# Patient Record
Sex: Female | Born: 1960
Health system: Southern US, Community
[De-identification: ages and names within clinical notes are randomized; demographics above are authoritative.]

## PROBLEM LIST (undated history)

## (undated) DIAGNOSIS — K529 Noninfective gastroenteritis and colitis, unspecified: Secondary | ICD-10-CM

## (undated) DIAGNOSIS — J449 Chronic obstructive pulmonary disease, unspecified: Secondary | ICD-10-CM

## (undated) HISTORY — PX: COLON SURGERY: SHX602

## (undated) HISTORY — PX: CHOLECYSTECTOMY: SHX55

---

## 2017-03-19 DIAGNOSIS — R4182 Altered mental status, unspecified: Secondary | ICD-10-CM | POA: Diagnosis not present

## 2017-03-19 DIAGNOSIS — M7989 Other specified soft tissue disorders: Secondary | ICD-10-CM | POA: Diagnosis not present

## 2017-03-19 DIAGNOSIS — G894 Chronic pain syndrome: Secondary | ICD-10-CM | POA: Diagnosis not present

## 2017-03-19 DIAGNOSIS — G92 Toxic encephalopathy: Secondary | ICD-10-CM | POA: Diagnosis not present

## 2017-03-19 DIAGNOSIS — J9601 Acute respiratory failure with hypoxia: Secondary | ICD-10-CM

## 2017-03-19 DIAGNOSIS — J441 Chronic obstructive pulmonary disease with (acute) exacerbation: Secondary | ICD-10-CM | POA: Diagnosis not present

## 2017-03-20 DIAGNOSIS — R4182 Altered mental status, unspecified: Secondary | ICD-10-CM | POA: Diagnosis not present

## 2017-03-20 DIAGNOSIS — M7989 Other specified soft tissue disorders: Secondary | ICD-10-CM | POA: Diagnosis not present

## 2017-03-20 DIAGNOSIS — J9601 Acute respiratory failure with hypoxia: Secondary | ICD-10-CM | POA: Diagnosis not present

## 2017-03-20 DIAGNOSIS — G894 Chronic pain syndrome: Secondary | ICD-10-CM | POA: Diagnosis not present

## 2017-03-21 DIAGNOSIS — R4182 Altered mental status, unspecified: Secondary | ICD-10-CM | POA: Diagnosis not present

## 2017-03-21 DIAGNOSIS — M7989 Other specified soft tissue disorders: Secondary | ICD-10-CM | POA: Diagnosis not present

## 2017-03-21 DIAGNOSIS — J9601 Acute respiratory failure with hypoxia: Secondary | ICD-10-CM | POA: Diagnosis not present

## 2017-03-21 DIAGNOSIS — G894 Chronic pain syndrome: Secondary | ICD-10-CM | POA: Diagnosis not present

## 2017-05-02 DIAGNOSIS — K219 Gastro-esophageal reflux disease without esophagitis: Secondary | ICD-10-CM | POA: Diagnosis not present

## 2017-05-02 DIAGNOSIS — J9601 Acute respiratory failure with hypoxia: Secondary | ICD-10-CM

## 2017-05-02 DIAGNOSIS — J441 Chronic obstructive pulmonary disease with (acute) exacerbation: Secondary | ICD-10-CM | POA: Diagnosis not present

## 2017-05-02 DIAGNOSIS — Z933 Colostomy status: Secondary | ICD-10-CM

## 2017-05-02 DIAGNOSIS — R0902 Hypoxemia: Secondary | ICD-10-CM

## 2017-05-03 DIAGNOSIS — J441 Chronic obstructive pulmonary disease with (acute) exacerbation: Secondary | ICD-10-CM | POA: Diagnosis not present

## 2017-05-03 DIAGNOSIS — Z933 Colostomy status: Secondary | ICD-10-CM | POA: Diagnosis not present

## 2017-05-03 DIAGNOSIS — R0902 Hypoxemia: Secondary | ICD-10-CM | POA: Diagnosis not present

## 2017-05-03 DIAGNOSIS — J9601 Acute respiratory failure with hypoxia: Secondary | ICD-10-CM | POA: Diagnosis not present

## 2017-11-06 DIAGNOSIS — Z933 Colostomy status: Secondary | ICD-10-CM

## 2017-11-06 DIAGNOSIS — K219 Gastro-esophageal reflux disease without esophagitis: Secondary | ICD-10-CM | POA: Diagnosis not present

## 2017-11-06 DIAGNOSIS — J441 Chronic obstructive pulmonary disease with (acute) exacerbation: Secondary | ICD-10-CM

## 2017-11-06 DIAGNOSIS — M797 Fibromyalgia: Secondary | ICD-10-CM

## 2017-11-06 DIAGNOSIS — J9601 Acute respiratory failure with hypoxia: Secondary | ICD-10-CM

## 2017-11-07 DIAGNOSIS — Z933 Colostomy status: Secondary | ICD-10-CM | POA: Diagnosis not present

## 2017-11-07 DIAGNOSIS — M797 Fibromyalgia: Secondary | ICD-10-CM | POA: Diagnosis not present

## 2017-11-07 DIAGNOSIS — J441 Chronic obstructive pulmonary disease with (acute) exacerbation: Secondary | ICD-10-CM | POA: Diagnosis not present

## 2017-11-07 DIAGNOSIS — J9601 Acute respiratory failure with hypoxia: Secondary | ICD-10-CM | POA: Diagnosis not present

## 2017-11-08 DIAGNOSIS — J9601 Acute respiratory failure with hypoxia: Secondary | ICD-10-CM | POA: Diagnosis not present

## 2017-11-08 DIAGNOSIS — M797 Fibromyalgia: Secondary | ICD-10-CM | POA: Diagnosis not present

## 2017-11-08 DIAGNOSIS — J441 Chronic obstructive pulmonary disease with (acute) exacerbation: Secondary | ICD-10-CM | POA: Diagnosis not present

## 2017-11-08 DIAGNOSIS — Z933 Colostomy status: Secondary | ICD-10-CM | POA: Diagnosis not present

## 2018-08-11 DIAGNOSIS — J441 Chronic obstructive pulmonary disease with (acute) exacerbation: Secondary | ICD-10-CM

## 2018-08-11 DIAGNOSIS — J9601 Acute respiratory failure with hypoxia: Secondary | ICD-10-CM

## 2018-08-11 DIAGNOSIS — K219 Gastro-esophageal reflux disease without esophagitis: Secondary | ICD-10-CM

## 2018-08-11 DIAGNOSIS — Z720 Tobacco use: Secondary | ICD-10-CM | POA: Diagnosis not present

## 2018-08-11 DIAGNOSIS — R51 Headache: Secondary | ICD-10-CM

## 2018-08-11 DIAGNOSIS — M797 Fibromyalgia: Secondary | ICD-10-CM

## 2018-08-11 DIAGNOSIS — Z933 Colostomy status: Secondary | ICD-10-CM

## 2018-08-12 DIAGNOSIS — J441 Chronic obstructive pulmonary disease with (acute) exacerbation: Secondary | ICD-10-CM | POA: Diagnosis not present

## 2018-08-12 DIAGNOSIS — J9601 Acute respiratory failure with hypoxia: Secondary | ICD-10-CM | POA: Diagnosis not present

## 2018-08-12 DIAGNOSIS — Z720 Tobacco use: Secondary | ICD-10-CM | POA: Diagnosis not present

## 2018-08-12 DIAGNOSIS — R51 Headache: Secondary | ICD-10-CM | POA: Diagnosis not present

## 2019-03-20 DIAGNOSIS — R1310 Dysphagia, unspecified: Secondary | ICD-10-CM | POA: Diagnosis not present

## 2019-03-20 DIAGNOSIS — Z720 Tobacco use: Secondary | ICD-10-CM

## 2019-03-20 DIAGNOSIS — Z933 Colostomy status: Secondary | ICD-10-CM

## 2019-03-20 DIAGNOSIS — J9601 Acute respiratory failure with hypoxia: Secondary | ICD-10-CM

## 2019-03-20 DIAGNOSIS — J441 Chronic obstructive pulmonary disease with (acute) exacerbation: Secondary | ICD-10-CM | POA: Diagnosis not present

## 2019-03-20 DIAGNOSIS — Z8719 Personal history of other diseases of the digestive system: Secondary | ICD-10-CM | POA: Diagnosis not present

## 2019-03-20 DIAGNOSIS — I959 Hypotension, unspecified: Secondary | ICD-10-CM | POA: Diagnosis not present

## 2019-03-20 DIAGNOSIS — R112 Nausea with vomiting, unspecified: Secondary | ICD-10-CM

## 2019-03-20 DIAGNOSIS — Z9889 Other specified postprocedural states: Secondary | ICD-10-CM | POA: Diagnosis not present

## 2019-03-21 DIAGNOSIS — Z8719 Personal history of other diseases of the digestive system: Secondary | ICD-10-CM | POA: Diagnosis not present

## 2019-03-21 DIAGNOSIS — R1310 Dysphagia, unspecified: Secondary | ICD-10-CM | POA: Diagnosis not present

## 2019-03-21 DIAGNOSIS — J441 Chronic obstructive pulmonary disease with (acute) exacerbation: Secondary | ICD-10-CM | POA: Diagnosis not present

## 2019-03-21 DIAGNOSIS — I361 Nonrheumatic tricuspid (valve) insufficiency: Secondary | ICD-10-CM | POA: Diagnosis not present

## 2019-03-21 DIAGNOSIS — Z9889 Other specified postprocedural states: Secondary | ICD-10-CM | POA: Diagnosis not present

## 2019-03-22 DIAGNOSIS — Z9889 Other specified postprocedural states: Secondary | ICD-10-CM | POA: Diagnosis not present

## 2019-03-22 DIAGNOSIS — J441 Chronic obstructive pulmonary disease with (acute) exacerbation: Secondary | ICD-10-CM | POA: Diagnosis not present

## 2019-03-22 DIAGNOSIS — R1310 Dysphagia, unspecified: Secondary | ICD-10-CM | POA: Diagnosis not present

## 2019-03-22 DIAGNOSIS — Z8719 Personal history of other diseases of the digestive system: Secondary | ICD-10-CM | POA: Diagnosis not present

## 2019-03-23 DIAGNOSIS — J441 Chronic obstructive pulmonary disease with (acute) exacerbation: Secondary | ICD-10-CM | POA: Diagnosis not present

## 2019-03-23 DIAGNOSIS — Z8719 Personal history of other diseases of the digestive system: Secondary | ICD-10-CM | POA: Diagnosis not present

## 2019-03-23 DIAGNOSIS — Z9889 Other specified postprocedural states: Secondary | ICD-10-CM | POA: Diagnosis not present

## 2019-03-23 DIAGNOSIS — R1310 Dysphagia, unspecified: Secondary | ICD-10-CM | POA: Diagnosis not present

## 2019-05-19 ENCOUNTER — Other Ambulatory Visit: Payer: Self-pay | Admitting: *Deleted

## 2019-05-19 DIAGNOSIS — Z20822 Contact with and (suspected) exposure to covid-19: Secondary | ICD-10-CM

## 2019-05-25 LAB — NOVEL CORONAVIRUS, NAA: SARS-CoV-2, NAA: NOT DETECTED

## 2019-05-25 LAB — SPECIMEN STATUS REPORT

## 2020-09-11 DIAGNOSIS — I361 Nonrheumatic tricuspid (valve) insufficiency: Secondary | ICD-10-CM | POA: Diagnosis not present

## 2020-10-08 ENCOUNTER — Emergency Department (HOSPITAL_COMMUNITY): Payer: Medicaid Other

## 2020-10-08 ENCOUNTER — Inpatient Hospital Stay (HOSPITAL_COMMUNITY)
Admission: EM | Admit: 2020-10-08 | Discharge: 2020-10-12 | DRG: 189 | Disposition: A | Discharge status: Home / Self Care | Payer: Medicaid Other | Attending: Internal Medicine | Admitting: Internal Medicine

## 2020-10-08 ENCOUNTER — Other Ambulatory Visit: Payer: Self-pay

## 2020-10-08 DIAGNOSIS — Z5900 Homelessness unspecified: Secondary | ICD-10-CM

## 2020-10-08 DIAGNOSIS — N179 Acute kidney failure, unspecified: Secondary | ICD-10-CM | POA: Diagnosis present

## 2020-10-08 DIAGNOSIS — J9602 Acute respiratory failure with hypercapnia: Principal | ICD-10-CM | POA: Diagnosis present

## 2020-10-08 DIAGNOSIS — Z20822 Contact with and (suspected) exposure to covid-19: Secondary | ICD-10-CM | POA: Diagnosis present

## 2020-10-08 DIAGNOSIS — Z9049 Acquired absence of other specified parts of digestive tract: Secondary | ICD-10-CM

## 2020-10-08 DIAGNOSIS — G9341 Metabolic encephalopathy: Secondary | ICD-10-CM | POA: Diagnosis present

## 2020-10-08 DIAGNOSIS — E860 Dehydration: Secondary | ICD-10-CM | POA: Diagnosis present

## 2020-10-08 DIAGNOSIS — F17200 Nicotine dependence, unspecified, uncomplicated: Secondary | ICD-10-CM | POA: Diagnosis present

## 2020-10-08 DIAGNOSIS — J449 Chronic obstructive pulmonary disease, unspecified: Secondary | ICD-10-CM | POA: Diagnosis present

## 2020-10-08 DIAGNOSIS — R400 Somnolence: Secondary | ICD-10-CM

## 2020-10-08 DIAGNOSIS — J439 Emphysema, unspecified: Secondary | ICD-10-CM | POA: Diagnosis present

## 2020-10-08 DIAGNOSIS — K5190 Ulcerative colitis, unspecified, without complications: Secondary | ICD-10-CM | POA: Diagnosis present

## 2020-10-08 DIAGNOSIS — G8929 Other chronic pain: Secondary | ICD-10-CM | POA: Diagnosis present

## 2020-10-08 DIAGNOSIS — F419 Anxiety disorder, unspecified: Secondary | ICD-10-CM | POA: Diagnosis present

## 2020-10-08 DIAGNOSIS — G9340 Encephalopathy, unspecified: Secondary | ICD-10-CM | POA: Diagnosis present

## 2020-10-08 DIAGNOSIS — J9600 Acute respiratory failure, unspecified whether with hypoxia or hypercapnia: Secondary | ICD-10-CM | POA: Diagnosis present

## 2020-10-08 DIAGNOSIS — E875 Hyperkalemia: Secondary | ICD-10-CM

## 2020-10-08 DIAGNOSIS — Z933 Colostomy status: Secondary | ICD-10-CM

## 2020-10-08 DIAGNOSIS — E162 Hypoglycemia, unspecified: Secondary | ICD-10-CM | POA: Diagnosis present

## 2020-10-08 DIAGNOSIS — R6511 Systemic inflammatory response syndrome (SIRS) of non-infectious origin with acute organ dysfunction: Secondary | ICD-10-CM | POA: Diagnosis present

## 2020-10-08 HISTORY — DX: Noninfective gastroenteritis and colitis, unspecified: K52.9

## 2020-10-08 HISTORY — DX: Chronic obstructive pulmonary disease, unspecified: J44.9

## 2020-10-08 LAB — CBG MONITORING, ED: Glucose-Capillary: 97 mg/dL (ref 70–99)

## 2020-10-08 MED ORDER — LACTATED RINGERS IV BOLUS (SEPSIS)
500.0000 mL | Freq: Once | INTRAVENOUS | Status: AC
  Administered 2020-10-09: 500 mL via INTRAVENOUS

## 2020-10-08 MED ORDER — LACTATED RINGERS IV SOLN: INTRAVENOUS | Status: DC

## 2020-10-08 MED ORDER — LACTATED RINGERS IV BOLUS (SEPSIS)
1000.0000 mL | Freq: Once | INTRAVENOUS | Status: AC
  Administered 2020-10-09: 1000 mL via INTRAVENOUS

## 2020-10-08 MED ORDER — SODIUM CHLORIDE 0.9 % IV SOLN
2.0000 g | Freq: Once | INTRAVENOUS | Status: AC
  Administered 2020-10-09: 2 g via INTRAVENOUS
  Filled 2020-10-08: qty 2

## 2020-10-08 MED ORDER — VANCOMYCIN HCL IN DEXTROSE 1-5 GM/200ML-% IV SOLN
1000.0000 mg | Freq: Once | INTRAVENOUS | Status: AC
  Administered 2020-10-09: 1000 mg via INTRAVENOUS
  Filled 2020-10-08: qty 200

## 2020-10-08 NOTE — ED Notes (Signed)
Pt is going to CT, EKG will be done after.

## 2020-10-08 NOTE — ED Triage Notes (Signed)
Pt arrived via ems from home due to altered mental status. Pt was found on the floor by roommate in the fetal position not answering questions

## 2020-10-09 ENCOUNTER — Emergency Department (HOSPITAL_COMMUNITY): Payer: Medicaid Other

## 2020-10-09 ENCOUNTER — Encounter (HOSPITAL_COMMUNITY): Payer: Self-pay | Admitting: Internal Medicine

## 2020-10-09 DIAGNOSIS — R0602 Shortness of breath: Secondary | ICD-10-CM | POA: Diagnosis present

## 2020-10-09 DIAGNOSIS — E162 Hypoglycemia, unspecified: Secondary | ICD-10-CM | POA: Diagnosis present

## 2020-10-09 DIAGNOSIS — E875 Hyperkalemia: Secondary | ICD-10-CM | POA: Diagnosis present

## 2020-10-09 DIAGNOSIS — Z5900 Homelessness unspecified: Secondary | ICD-10-CM | POA: Diagnosis not present

## 2020-10-09 DIAGNOSIS — J449 Chronic obstructive pulmonary disease, unspecified: Secondary | ICD-10-CM | POA: Diagnosis present

## 2020-10-09 DIAGNOSIS — Z9049 Acquired absence of other specified parts of digestive tract: Secondary | ICD-10-CM | POA: Diagnosis not present

## 2020-10-09 DIAGNOSIS — G8929 Other chronic pain: Secondary | ICD-10-CM | POA: Diagnosis present

## 2020-10-09 DIAGNOSIS — Z933 Colostomy status: Secondary | ICD-10-CM | POA: Diagnosis not present

## 2020-10-09 DIAGNOSIS — K5190 Ulcerative colitis, unspecified, without complications: Secondary | ICD-10-CM | POA: Diagnosis present

## 2020-10-09 DIAGNOSIS — F17200 Nicotine dependence, unspecified, uncomplicated: Secondary | ICD-10-CM | POA: Diagnosis present

## 2020-10-09 DIAGNOSIS — R6511 Systemic inflammatory response syndrome (SIRS) of non-infectious origin with acute organ dysfunction: Secondary | ICD-10-CM | POA: Diagnosis present

## 2020-10-09 DIAGNOSIS — Z20822 Contact with and (suspected) exposure to covid-19: Secondary | ICD-10-CM | POA: Diagnosis present

## 2020-10-09 DIAGNOSIS — J9600 Acute respiratory failure, unspecified whether with hypoxia or hypercapnia: Secondary | ICD-10-CM | POA: Diagnosis present

## 2020-10-09 DIAGNOSIS — J9602 Acute respiratory failure with hypercapnia: Secondary | ICD-10-CM | POA: Diagnosis present

## 2020-10-09 DIAGNOSIS — G9341 Metabolic encephalopathy: Secondary | ICD-10-CM | POA: Diagnosis present

## 2020-10-09 DIAGNOSIS — F419 Anxiety disorder, unspecified: Secondary | ICD-10-CM | POA: Diagnosis present

## 2020-10-09 DIAGNOSIS — G9340 Encephalopathy, unspecified: Secondary | ICD-10-CM | POA: Diagnosis not present

## 2020-10-09 DIAGNOSIS — N179 Acute kidney failure, unspecified: Secondary | ICD-10-CM | POA: Diagnosis present

## 2020-10-09 DIAGNOSIS — J439 Emphysema, unspecified: Secondary | ICD-10-CM | POA: Diagnosis present

## 2020-10-09 DIAGNOSIS — E860 Dehydration: Secondary | ICD-10-CM | POA: Diagnosis present

## 2020-10-09 LAB — CBC WITH DIFFERENTIAL/PLATELET
Abs Immature Granulocytes: 0.02 10*3/uL (ref 0.00–0.07)
Abs Immature Granulocytes: 0.05 10*3/uL (ref 0.00–0.07)
Basophils Absolute: 0 10*3/uL (ref 0.0–0.1)
Basophils Absolute: 0 10*3/uL (ref 0.0–0.1)
Basophils Relative: 1 %
Basophils Relative: 1 %
Eosinophils Absolute: 0.1 10*3/uL (ref 0.0–0.5)
Eosinophils Absolute: 0 10*3/uL (ref 0.0–0.5)
Eosinophils Relative: 1 %
Eosinophils Relative: 0 %
HCT: 45.8 % (ref 36.0–46.0)
HCT: 42.2 % (ref 36.0–46.0)
Hemoglobin: 13.4 g/dL (ref 12.0–15.0)
Hemoglobin: 12.2 g/dL (ref 12.0–15.0)
Immature Granulocytes: 0 %
Immature Granulocytes: 1 %
Lymphocytes Relative: 6 %
Lymphocytes Relative: 10 %
Lymphs Abs: 0.4 10*3/uL — ABNORMAL LOW (ref 0.7–4.0)
Lymphs Abs: 0.6 10*3/uL — ABNORMAL LOW (ref 0.7–4.0)
MCH: 28.6 pg (ref 26.0–34.0)
MCH: 28.2 pg (ref 26.0–34.0)
MCHC: 29.3 g/dL — ABNORMAL LOW (ref 30.0–36.0)
MCHC: 28.9 g/dL — ABNORMAL LOW (ref 30.0–36.0)
MCV: 97.7 fL (ref 80.0–100.0)
MCV: 97.7 fL (ref 80.0–100.0)
Monocytes Absolute: 0.3 10*3/uL (ref 0.1–1.0)
Monocytes Absolute: 0.6 10*3/uL (ref 0.1–1.0)
Monocytes Relative: 5 %
Monocytes Relative: 10 %
Neutro Abs: 5.2 10*3/uL (ref 1.7–7.7)
Neutro Abs: 4.9 10*3/uL (ref 1.7–7.7)
Neutrophils Relative %: 87 %
Neutrophils Relative %: 78 %
Platelets: 167 10*3/uL (ref 150–400)
Platelets: 168 10*3/uL (ref 150–400)
RBC: 4.69 MIL/uL (ref 3.87–5.11)
RBC: 4.32 MIL/uL (ref 3.87–5.11)
RDW: 14.4 % (ref 11.5–15.5)
RDW: 14.3 % (ref 11.5–15.5)
WBC: 6 10*3/uL (ref 4.0–10.5)
WBC: 6.2 10*3/uL (ref 4.0–10.5)
nRBC: 0 % (ref 0.0–0.2)
nRBC: 0 % (ref 0.0–0.2)

## 2020-10-09 LAB — I-STAT VENOUS BLOOD GAS, ED
Acid-Base Excess: 1 mmol/L (ref 0.0–2.0)
Bicarbonate: 30.7 mmol/L — ABNORMAL HIGH (ref 20.0–28.0)
Calcium, Ion: 1.16 mmol/L (ref 1.15–1.40)
HCT: 45 % (ref 36.0–46.0)
Hemoglobin: 15.3 g/dL — ABNORMAL HIGH (ref 12.0–15.0)
O2 Saturation: 82 %
Potassium: 5.1 mmol/L (ref 3.5–5.1)
Sodium: 142 mmol/L (ref 135–145)
TCO2: 33 mmol/L — ABNORMAL HIGH (ref 22–32)
pCO2, Ven: 69.5 mmHg — ABNORMAL HIGH (ref 44.0–60.0)
pH, Ven: 7.253 (ref 7.250–7.430)
pO2, Ven: 55 mmHg — ABNORMAL HIGH (ref 32.0–45.0)

## 2020-10-09 LAB — URINALYSIS, COMPLETE (UACMP) WITH MICROSCOPIC
Bilirubin Urine: NEGATIVE
Glucose, UA: NEGATIVE mg/dL
Hgb urine dipstick: NEGATIVE
Ketones, ur: NEGATIVE mg/dL
Leukocytes,Ua: NEGATIVE
Nitrite: NEGATIVE
Protein, ur: NEGATIVE mg/dL
Specific Gravity, Urine: 1.019 (ref 1.005–1.030)
pH: 5 (ref 5.0–8.0)

## 2020-10-09 LAB — I-STAT ARTERIAL BLOOD GAS, ED
Acid-Base Excess: 0 mmol/L (ref 0.0–2.0)
Acid-Base Excess: 0 mmol/L (ref 0.0–2.0)
Acid-Base Excess: 2 mmol/L (ref 0.0–2.0)
Bicarbonate: 28.4 mmol/L — ABNORMAL HIGH (ref 20.0–28.0)
Bicarbonate: 29.8 mmol/L — ABNORMAL HIGH (ref 20.0–28.0)
Bicarbonate: 29.3 mmol/L — ABNORMAL HIGH (ref 20.0–28.0)
Calcium, Ion: 1.29 mmol/L (ref 1.15–1.40)
Calcium, Ion: 1.28 mmol/L (ref 1.15–1.40)
Calcium, Ion: 1.22 mmol/L (ref 1.15–1.40)
HCT: 40 % (ref 36.0–46.0)
HCT: 37 % (ref 36.0–46.0)
HCT: 37 % (ref 36.0–46.0)
Hemoglobin: 13.6 g/dL (ref 12.0–15.0)
Hemoglobin: 12.6 g/dL (ref 12.0–15.0)
Hemoglobin: 12.6 g/dL (ref 12.0–15.0)
O2 Saturation: 88 %
O2 Saturation: 97 %
O2 Saturation: 93 %
Patient temperature: 97.4
Patient temperature: 99.2
Potassium: 4.9 mmol/L (ref 3.5–5.1)
Potassium: 4.5 mmol/L (ref 3.5–5.1)
Potassium: 4.5 mmol/L (ref 3.5–5.1)
Sodium: 143 mmol/L (ref 135–145)
Sodium: 142 mmol/L (ref 135–145)
Sodium: 142 mmol/L (ref 135–145)
TCO2: 30 mmol/L (ref 22–32)
TCO2: 32 mmol/L (ref 22–32)
TCO2: 31 mmol/L (ref 22–32)
pCO2 arterial: 58.1 mmHg — ABNORMAL HIGH (ref 32.0–48.0)
pCO2 arterial: 73.1 mmHg (ref 32.0–48.0)
pCO2 arterial: 55.5 mmHg — ABNORMAL HIGH (ref 32.0–48.0)
pH, Arterial: 7.293 — ABNORMAL LOW (ref 7.350–7.450)
pH, Arterial: 7.22 — ABNORMAL LOW (ref 7.350–7.450)
pH, Arterial: 7.331 — ABNORMAL LOW (ref 7.350–7.450)
pO2, Arterial: 61 mmHg — ABNORMAL LOW (ref 83.0–108.0)
pO2, Arterial: 114 mmHg — ABNORMAL HIGH (ref 83.0–108.0)
pO2, Arterial: 75 mmHg — ABNORMAL LOW (ref 83.0–108.0)

## 2020-10-09 LAB — COMPREHENSIVE METABOLIC PANEL
ALT: 11 U/L (ref 0–44)
AST: 18 U/L (ref 15–41)
Albumin: 3.6 g/dL (ref 3.5–5.0)
Alkaline Phosphatase: 105 U/L (ref 38–126)
Anion gap: 15 (ref 5–15)
BUN: 28 mg/dL — ABNORMAL HIGH (ref 8–23)
CO2: 23 mmol/L (ref 22–32)
Calcium: 9.1 mg/dL (ref 8.9–10.3)
Chloride: 104 mmol/L (ref 98–111)
Creatinine, Ser: 1.14 mg/dL — ABNORMAL HIGH (ref 0.44–1.00)
GFR, Estimated: 55 mL/min — ABNORMAL LOW (ref >60)
Glucose, Bld: 106 mg/dL — ABNORMAL HIGH (ref 70–99)
Potassium: 5.3 mmol/L — ABNORMAL HIGH (ref 3.5–5.1)
Sodium: 142 mmol/L (ref 135–145)
Total Bilirubin: 0.5 mg/dL (ref 0.3–1.2)
Total Protein: 6.3 g/dL — ABNORMAL LOW (ref 6.5–8.1)

## 2020-10-09 LAB — PROTIME-INR
INR: 1.1 (ref 0.8–1.2)
Prothrombin Time: 13.7 seconds (ref 11.4–15.2)

## 2020-10-09 LAB — CBG MONITORING, ED
Glucose-Capillary: 66 mg/dL — ABNORMAL LOW (ref 70–99)
Glucose-Capillary: 66 mg/dL — ABNORMAL LOW (ref 70–99)
Glucose-Capillary: 62 mg/dL — ABNORMAL LOW (ref 70–99)
Glucose-Capillary: 56 mg/dL — ABNORMAL LOW (ref 70–99)
Glucose-Capillary: 65 mg/dL — ABNORMAL LOW (ref 70–99)
Glucose-Capillary: 142 mg/dL — ABNORMAL HIGH (ref 70–99)
Glucose-Capillary: 107 mg/dL — ABNORMAL HIGH (ref 70–99)

## 2020-10-09 LAB — COMPREHENSIVE METABOLIC PANEL WITH GFR
ALT: 11 U/L (ref 0–44)
AST: 17 U/L (ref 15–41)
Albumin: 2.7 g/dL — ABNORMAL LOW (ref 3.5–5.0)
Alkaline Phosphatase: 82 U/L (ref 38–126)
Anion gap: 8 (ref 5–15)
BUN: 23 mg/dL (ref 8–23)
CO2: 27 mmol/L (ref 22–32)
Calcium: 8.3 mg/dL — ABNORMAL LOW (ref 8.9–10.3)
Chloride: 107 mmol/L (ref 98–111)
Creatinine, Ser: 0.95 mg/dL (ref 0.44–1.00)
GFR, Estimated: >60 mL/min
Glucose, Bld: 132 mg/dL — ABNORMAL HIGH (ref 70–99)
Potassium: 4.6 mmol/L (ref 3.5–5.1)
Sodium: 142 mmol/L (ref 135–145)
Total Bilirubin: 0.5 mg/dL (ref 0.3–1.2)
Total Protein: 4.8 g/dL — ABNORMAL LOW (ref 6.5–8.1)

## 2020-10-09 LAB — RAPID URINE DRUG SCREEN, HOSP PERFORMED
Amphetamines: NOT DETECTED
Barbiturates: NOT DETECTED
Benzodiazepines: POSITIVE — AB
Cocaine: NOT DETECTED
Opiates: NOT DETECTED
Tetrahydrocannabinol: NOT DETECTED

## 2020-10-09 LAB — LIPASE, BLOOD: Lipase: 25 U/L (ref 11–51)

## 2020-10-09 LAB — ACETAMINOPHEN LEVEL: Acetaminophen (Tylenol), Serum: <10 ug/mL — ABNORMAL LOW (ref 10–30)

## 2020-10-09 LAB — RESPIRATORY PANEL BY RT PCR (FLU A&B, COVID)
Influenza A by PCR: NEGATIVE
Influenza B by PCR: NEGATIVE
SARS Coronavirus 2 by RT PCR: NEGATIVE

## 2020-10-09 LAB — HEMOGLOBIN A1C
Hgb A1c MFr Bld: 5.2 % (ref 4.8–5.6)
Mean Plasma Glucose: 102.54 mg/dL

## 2020-10-09 LAB — CK
Total CK: 61 U/L (ref 38–234)
Total CK: 40 U/L (ref 38–234)

## 2020-10-09 LAB — LACTIC ACID, PLASMA
Lactic Acid, Venous: 1.6 mmol/L (ref 0.5–1.9)
Lactic Acid, Venous: 1.6 mmol/L (ref 0.5–1.9)

## 2020-10-09 LAB — SALICYLATE LEVEL: Salicylate Lvl: <7 mg/dL — ABNORMAL LOW (ref 7.0–30.0)

## 2020-10-09 LAB — PROCALCITONIN: Procalcitonin: 0.13 ng/mL

## 2020-10-09 LAB — AMMONIA
Ammonia: 18 umol/L (ref 9–35)
Ammonia: 11 umol/L (ref 9–35)

## 2020-10-09 LAB — ETHANOL: Alcohol, Ethyl (B): <10 mg/dL (ref <10)

## 2020-10-09 LAB — TSH: TSH: 0.124 u[IU]/mL — ABNORMAL LOW (ref 0.350–4.500)

## 2020-10-09 MED ORDER — ALBUTEROL SULFATE (2.5 MG/3ML) 0.083% IN NEBU: 2.5000 mg | INHALATION_SOLUTION | RESPIRATORY_TRACT | Status: DC | PRN

## 2020-10-09 MED ORDER — DEXTROSE 5 % IV SOLN: INTRAVENOUS | Status: AC

## 2020-10-09 MED ORDER — LACTATED RINGERS IV SOLN: INTRAVENOUS | Status: DC

## 2020-10-09 MED ORDER — VANCOMYCIN HCL 750 MG/150ML IV SOLN: 750.0000 mg | INTRAVENOUS | Status: DC

## 2020-10-09 MED ORDER — SODIUM CHLORIDE 0.9 % IV SOLN: 2.0000 g | INTRAVENOUS | Status: DC

## 2020-10-09 MED ORDER — IPRATROPIUM BROMIDE 0.02 % IN SOLN
0.5000 mg | Freq: Four times a day (QID) | RESPIRATORY_TRACT | Status: DC
  Administered 2020-10-09: 0.5 mg via RESPIRATORY_TRACT
  Filled 2020-10-09: qty 2.5

## 2020-10-09 MED ORDER — SODIUM CHLORIDE 0.9 % IV SOLN
2.0000 g | Freq: Two times a day (BID) | INTRAVENOUS | Status: DC
  Administered 2020-10-09 – 2020-10-11 (×5): 2 g via INTRAVENOUS
  Filled 2020-10-09 (×5): qty 2

## 2020-10-09 MED ORDER — ALBUTEROL SULFATE (2.5 MG/3ML) 0.083% IN NEBU
2.5000 mg | INHALATION_SOLUTION | Freq: Four times a day (QID) | RESPIRATORY_TRACT | Status: DC
  Administered 2020-10-09: 2.5 mg via RESPIRATORY_TRACT
  Filled 2020-10-09: qty 3

## 2020-10-09 MED ORDER — BUDESONIDE 0.25 MG/2ML IN SUSP
0.2500 mg | Freq: Two times a day (BID) | RESPIRATORY_TRACT | Status: DC
  Administered 2020-10-10 – 2020-10-11 (×3): 0.25 mg via RESPIRATORY_TRACT
  Filled 2020-10-09 (×8): qty 2

## 2020-10-09 MED ORDER — IOHEXOL 300 MG/ML  SOLN
100.0000 mL | Freq: Once | INTRAMUSCULAR | Status: AC | PRN
  Administered 2020-10-09: 100 mL via INTRAVENOUS

## 2020-10-09 MED ORDER — ACETAMINOPHEN 650 MG RE SUPP: 650.0000 mg | Freq: Four times a day (QID) | RECTAL | Status: DC | PRN

## 2020-10-09 MED ORDER — REVEFENACIN 175 MCG/3ML IN SOLN
175.0000 ug | Freq: Every day | RESPIRATORY_TRACT | Status: DC
  Administered 2020-10-09 – 2020-10-12 (×4): 175 ug via RESPIRATORY_TRACT
  Filled 2020-10-09 (×4): qty 3

## 2020-10-09 MED ORDER — VANCOMYCIN HCL 500 MG/100ML IV SOLN
500.0000 mg | Freq: Two times a day (BID) | INTRAVENOUS | Status: DC
  Administered 2020-10-09 – 2020-10-10 (×2): 500 mg via INTRAVENOUS
  Filled 2020-10-09 (×3): qty 100

## 2020-10-09 MED ORDER — ARFORMOTEROL TARTRATE 15 MCG/2ML IN NEBU
15.0000 ug | INHALATION_SOLUTION | Freq: Two times a day (BID) | RESPIRATORY_TRACT | Status: DC
  Administered 2020-10-09 – 2020-10-12 (×7): 15 ug via RESPIRATORY_TRACT
  Filled 2020-10-09 (×11): qty 2

## 2020-10-09 MED ORDER — ACETAMINOPHEN 325 MG PO TABS
650.0000 mg | ORAL_TABLET | Freq: Four times a day (QID) | ORAL | Status: DC | PRN
  Administered 2020-10-09 – 2020-10-11 (×4): 650 mg via ORAL
  Filled 2020-10-09 (×4): qty 2

## 2020-10-09 MED ORDER — INSULIN ASPART 100 UNIT/ML ~~LOC~~ SOLN: 0.0000 [IU] | SUBCUTANEOUS | Status: DC

## 2020-10-09 NOTE — ED Notes (Signed)
Please inform patient that Katelyn Villegas called

## 2020-10-09 NOTE — Progress Notes (Signed)
Pharmacy Antibiotic Note  Katelyn Villegas is a 59 y.o. female admitted on 10/08/2020 with sepsis.  Pharmacy has been consulted for vancomycin and cefepime dosing. Vancomycin 1gm and cefepime 2gm ordered in ED  Plan: Cont cefepime 2gm IV q24 hours Cont vancomycin 750 mg IV q24 hours F/u renal function, cultures and clinical course  Height: 5' 4"  (162.6 cm) Weight: 49.9 kg (110 lb) IBW/kg (Calculated) : 54.7  Temp (24hrs), Avg:100.6 F (38.1 C), Min:100.6 F (38.1 C), Max:100.6 F (38.1 C)  Recent Labs  Lab 10/08/20 2314 10/08/20 2335  WBC 6.0  --   CREATININE 1.14*  --   LATICACIDVEN  --  1.6    Estimated Creatinine Clearance: 40.8 mL/min (A) (by C-G formula based on SCr of 1.14 mg/dL (H)).    Not on File  Thank you for allowing pharmacy to be a part of this patient's care.  Excell Seltzer P[place]et 10/09/2020 1:26 AM

## 2020-10-09 NOTE — Progress Notes (Signed)
Notified pharmacy for nebulizers.

## 2020-10-09 NOTE — Consult Note (Signed)
NAME:  Katelyn Villegas, MRN:  [id number], DOB:  Aug 24, 1961, LOS: 0 ADMISSION DATE:  10/08/2020, CONSULTATION DATE:  10/09/2020 REFERRING MD:  Gean Birchwood, MD CHIEF COMPLAINT:  Encephalopathy   Brief History   Katelyn Villegas is a 59 year old woman with history of COPD, ulcerative colitis with colostomy who was brought to the ER on 11/15 after being found on the floor by her friend. She is being admitted for hypercapnic respiratory failure and possible enteritis and cystitis.  History of present illness   59 year old woman, daily smoker with COPD and ulcerative colitis s/p colostomy who was found on the floor on 11/15 with last check in about 24 hours before. She is somnolent but responsive to verbal stimuli. She has been placed on Bipap therapy for hypercapnic respiratory failure pH 7.22, pCO2 73.1, pO2 114. CT head is negative for acute intracranial injuries and no acute cervical spine injuries. Significant emphysema noted on upper lung fields. CT Abd/Pelvis also noted emphysematous changed at the lung bases. Fluid-filled distal small bowel concerning for possible enteritis and slight bladder wall thickening and mild perivesicular hazy stranding concerning for cystitis.    Past Medical History  Ulcerative Colitis COPD/Emphysema Tobacco Use  Significant Hospital Events   Bipap started 11/16  Consults:  PCCM  Procedures:    Significant Diagnostic Tests:  CT head & C-Spine - No acute injuries noted. Degenerative disc disease.   CT Abd/Pelvis 11/16: 1. 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 Fluid-filled appearance of the distal small bowel is nonspecific, but can be seen with enteritis. No significant wall thickening or distention is seen. 2. Question slight bladder wall thickening and mild perivesicular hazy stranding. Correlate with urinalysis to exclude cystitis. 3. Diffuse edematous changes throughout the mesentery likely with small volume  free fluid in the abdomen and pelvis. Diffuse body wall edema noted as well. Correlate for volume third-spacing/anasarca. 4. Periportal edema is present, can be seen in the setting of volume overload versus intrinsic liver disease. Correlate with LFTs. 5. Splenomegaly, similar to prior.  Chest X-ray 11/15 No opacities or pleural effusions.   Micro Data:  11/16 Bld Cx>> 11/16 Influenza/Covid PCR - Negative   Antimicrobials:  Cefepime 11/16>> Vancomycin 11/16>>  Interim history/subjective:  ABG after bipap for almost an hour (patient intermittently pulling off mask) shows pH 7.331, pCO2 55.5, pO2 75  Objective   Blood pressure 112/83, pulse 78, temperature 99.2 F (37.3 C), resp. rate 10, height 5' 4"  (1.626 m), weight 49.9 kg, SpO2 99 %.    Vent Mode: BIPAP;PCV FiO2 (%):  [30 %] 30 % Set Rate:  [15 bmp] 15 bmp PEEP:  [5 cmH20] 5 cmH20  No intake or output data in the 24 hours ending 10/09/20 0756 Filed Weights   10/08/20 2319  Weight: 49.9 kg    Examination: General: chronically ill appearing woman, no acute distress, bipap mask in place HENT: moist mucous membranes, sclera anicteric Lungs: diminished breath sounds. No wheezing, rhonchi or crackles Cardiovascular: RRR, distant S1S2. No murmurs Abdomen: soft, non-tender, ostomy bag in place  Extremities: no edema, warm Neuro: moving all extremities, Alert and oriented x 3. Somnolent.  Resolved Hospital Problem list     Assessment & Plan:  Acute Hypercapnic Respiratory Failure In setting of underlying COPD/Emphysema, daily tobacco use and acute illness secondary to enteritis - Agree with intermittent bipap throughout the day and recommend nocturnal bipap tonight - Start budesonide BID, yupleri daily and brovana BID nebulizer treatments - Will consider  adding steroids for COPD once infectious workup is complete - Check procalcitonin and extended respiratory pathogen panel  Acute Encephalopathy - Likely secondary to  hypercapnia as discussed above. Patient is somnolent, but A&Ox3. - Hold further benzodiazpine administration or other sedating agents - EEG ordered by primary team for possible seizure acitivity - Infectious etiology being evaluated. Cultures pending and being covered with cefepime and vancomycin  Enteritis/Hx of Ulcerative colitis - Noted on CT abdomen/pelvis - Continue to monitor ostomy output, consider sending stool studies if high stool output noted. - Abdominal exam benign at this time  Disposition: Patient is stable for the progressive care unit with intermittent bipap use. PCCM will continue to follow for the respiratory failure.   Best practice:  Diet: NPO Pain/Anxiety/Delirium protocol (if indicated): N/A VAP protocol (if indicated): N/A DVT prophylaxis: SCMs GI prophylaxis: n/a Glucose control: n/a Mobility: bed rest Code Status: Full Family Communication: Phone numbers listed in chart are not in service Disposition: Progressive care unit  Labs   CBC: Recent Labs  Lab 10/08/20 2314 10/09/20 0049 10/09/20 0136 10/09/20 0553 10/09/20 0604  WBC 6.0  --   --  6.2  --   NEUTROABS 5.2  --   --  4.9  --   HGB 13.4 15.3* 13.6 12.2 12.6  HCT 45.8 45.0 40.0 42.2 37.0  MCV 97.7  --   --  97.7  --   PLT 167  --   --  168  --     Basic Metabolic Panel: Recent Labs  Lab 10/08/20 2314 10/09/20 0049 10/09/20 0136 10/09/20 0553 10/09/20 0604  NA 142 142 143 142 142  K 5.3* 5.1 4.9 4.6 4.5  CL 104  --   --  107  --   CO2 23  --   --  27  --   GLUCOSE 106*  --   --  132*  --   BUN 28*  --   --  23  --   CREATININE 1.14*  --   --  0.95  --   CALCIUM 9.1  --   --  8.3*  --    GFR: Estimated Creatinine Clearance: 49 mL/min (by C-G formula based on SCr of 0.95 mg/dL). Recent Labs  Lab 10/08/20 2314 10/08/20 2335 10/09/20 0409 10/09/20 0553  WBC 6.0  --   --  6.2  LATICACIDVEN  --  1.6 1.6  --     Liver Function Tests: Recent Labs  Lab 10/08/20 2314  10/09/20 0553  AST 18 17  ALT 11 11  ALKPHOS 105 82  BILITOT 0.5 0.5  PROT 6.3* 4.8*  ALBUMIN 3.6 2.7*   Recent Labs  Lab 10/09/20 0449  LIPASE 25   Recent Labs  Lab 10/08/20 2314 10/09/20 0553  AMMONIA 18 11    ABG    Component Value Date/Time   PHART 7.220 (L) 10/09/2020 0604   PCO2ART 73.1 (HH) 10/09/2020 0604   PO2ART 114 (H) 10/09/2020 0604   HCO3 29.8 (H) 10/09/2020 0604   TCO2 32 10/09/2020 0604   O2SAT 97.0 10/09/2020 0604     Coagulation Profile: Recent Labs  Lab 10/08/20 2314  INR 1.1    Cardiac Enzymes: Recent Labs  Lab 10/09/20 0029 10/09/20 0553  CKTOTAL 61 40    HbA1C: No results found for: HGBA1C  CBG: Recent Labs  Lab 10/08/20 2316  GLUCAP 97    Review of Systems:   Review of Systems  Respiratory: Negative for cough, hemoptysis and sputum  production.   Cardiovascular: Negative for chest pain.  Gastrointestinal: Negative for abdominal pain, nausea and vomiting.  Genitourinary: Negative for dysuria and hematuria.  Musculoskeletal: Negative.   Neurological: Negative for dizziness and headaches.    Past Medical History  She,  has a past medical history of Colitis and COPD (chronic obstructive pulmonary disease) ([place]).   Surgical History    Past Surgical History:  Procedure Laterality Date  . CHOLECYSTECTOMY    . COLON SURGERY       Social History   reports that she has never smoked. She has never used smokeless tobacco. She reports that she does not use drugs.   Family History   Her Family history is unknown by patient.   Allergies Not on File   Home Medications  Prior to Admission medications   Not on File     Critical care time: 58mns    JFreda Jackson MD LRigginsOffice: [phone]  See Amion for Pager Details

## 2020-10-09 NOTE — H&P (Signed)
History and Physical    Katelyn Villegas HUO:[id number] DOB: 10-25-1961 DOA: 10/08/2020  PCP: Patient, No Pcp Per  Patient coming from: Home.  History obtained from ER physician and previous records in care everywhere.  Patient is encephalopathic.  Unable to reach any family members no contact numbers.  Chief Complaint: Encephalopathy.  HPI: Katelyn Villegas is a 59 y.o. female with history of COPD, ulcerative colitis with colostomy was found to be on the floor by patient's friend and was brought to the ER.  It is not known how long she was on the floor but last seen 24 hours previously before the fall as per the report by patient's friend who told the ER physician.  On my exam patient appears lethargic but states she takes Valium but denies any chest pain or shortness of breath.  ED Course: In the ER patient appears lethargic and ABG shows pH of 7.29 PCO2 of 58.1.  Chest x-ray was unremarkable.  Labs are significant for creatinine 1.1 potassium of 5.3 CBC unremarkable ammonia was normal Covid test was negative.  Patient CT head and C-spine was unremarkable.  Since patient has a colostomy bag CT abdomen pelvis was done which shows features concerning for fluid overload versus enteritis and cystitis.  Patient was mildly febrile with temperature 101.4 F for which patient was empirically started on antibiotic.  No definite signs of any infection at this time.  Urine drug screen is positive for benzo.  Urine shows bacteria otherwise unremarkable.  Review of Systems: As per HPI, rest all negative.   Past Medical History:  Diagnosis Date  . Colitis   . COPD (chronic obstructive pulmonary disease) ([place])     Past Surgical History:  Procedure Laterality Date  . CHOLECYSTECTOMY    . COLON SURGERY       reports that she has never smoked. She has never used smokeless tobacco. She reports that she does not use drugs. No history on file for alcohol use.  Not on File  Family History  Family history  unknown: Yes    Prior to Admission medications   Not on File    Physical Exam: Constitutional: Moderately built and nourished. Vitals:   10/09/20 0345 10/09/20 0415 10/09/20 0456 10/09/20 0515  BP: 92/67 112/77  101/66  Pulse: 88 85  84  Resp: 12 (!) 22  18  Temp:   98.6 F (37 C)   TempSrc:   Oral   SpO2: 100% 100%  100%  Weight:      Height:       Eyes: Anicteric no pallor. ENMT: No discharge from the ears eyes nose or mouth. Neck: No mass felt.  No neck rigidity. Respiratory: No rhonchi or crepitations. Cardiovascular: S1-S2 heard. Abdomen: Soft nontender colostomy bag seen. Musculoskeletal: No edema. Skin: No rash. Neurologic: Patient is lethargic but answers questions oriented to her name only.  Moving all extremities. Psychiatric: Lethargic.   Labs on Admission: I have personally reviewed following labs and imaging studies  CBC: Recent Labs  Lab 10/08/20 2314 10/09/20 0049 10/09/20 0136  WBC 6.0  --   --   NEUTROABS 5.2  --   --   HGB 13.4 15.3* 13.6  HCT 45.8 45.0 40.0  MCV 97.7  --   --   PLT 167  --   --    Basic Metabolic Panel: Recent Labs  Lab 10/08/20 2314 10/09/20 0049 10/09/20 0136  NA 142 142 143  K 5.3* 5.1 4.9  CL 104  --   --  CO2 23  --   --   GLUCOSE 106*  --   --   BUN 28*  --   --   CREATININE 1.14*  --   --   CALCIUM 9.1  --   --    GFR: Estimated Creatinine Clearance: 40.8 mL/min (A) (by C-G formula based on SCr of 1.14 mg/dL (H)). Liver Function Tests: Recent Labs  Lab 10/08/20 2314  AST 18  ALT 11  ALKPHOS 105  BILITOT 0.5  PROT 6.3*  ALBUMIN 3.6   No results for input(s): LIPASE, AMYLASE in the last 168 hours. Recent Labs  Lab 10/08/20 2314  AMMONIA 18   Coagulation Profile: Recent Labs  Lab 10/08/20 2314  INR 1.1   Cardiac Enzymes: Recent Labs  Lab 10/09/20 0029  CKTOTAL 61   BNP (last 3 results) No results for input(s): PROBNP in the last 8760 hours. HbA1C: No results for input(s): HGBA1C  in the last 72 hours. CBG: Recent Labs  Lab 10/08/20 2316  GLUCAP 97   Lipid Profile: No results for input(s): CHOL, HDL, LDLCALC, TRIG, CHOLHDL, LDLDIRECT in the last 72 hours. Thyroid Function Tests: No results for input(s): TSH, T4TOTAL, FREET4, T3FREE, THYROIDAB in the last 72 hours. Anemia Panel: No results for input(s): VITAMINB12, FOLATE, FERRITIN, TIBC, IRON, RETICCTPCT in the last 72 hours. Urine analysis:    Component Value Date/Time   COLORURINE YELLOW 10/09/2020 0017   APPEARANCEUR CLEAR 10/09/2020 0017   LABSPEC 1.019 10/09/2020 0017   PHURINE 5.0 10/09/2020 0017   GLUCOSEU NEGATIVE 10/09/2020 0017   HGBUR NEGATIVE 10/09/2020 0017   BILIRUBINUR NEGATIVE 10/09/2020 0017   KETONESUR NEGATIVE 10/09/2020 0017   PROTEINUR NEGATIVE 10/09/2020 0017   NITRITE NEGATIVE 10/09/2020 0017   LEUKOCYTESUR NEGATIVE 10/09/2020 0017   Sepsis Labs: @LABRCNTIP (procalcitonin:4,lacticidven:4) ) Recent Results (from the past 240 hour(s))  Respiratory Panel by RT PCR (Flu A&B, Covid) - Nasopharyngeal Swab     Status: None   Collection Time: 10/09/20 12:12 AM   Specimen: Nasopharyngeal Swab  Result Value Ref Range Status   SARS Coronavirus 2 by RT PCR NEGATIVE NEGATIVE Final    Comment: (NOTE) SARS-CoV-2 target nucleic acids are NOT DETECTED.  The SARS-CoV-2 RNA is generally detectable in upper respiratoy specimens during the acute phase of infection. The lowest concentration of SARS-CoV-2 viral copies this assay can detect is 131 copies/mL. A negative result does not preclude SARS-Cov-2 infection and should not be used as the sole basis for treatment or other patient management decisions. A negative result may occur with  improper specimen collection/handling, submission of specimen other than nasopharyngeal swab, presence of viral mutation(s) within the areas targeted by this assay, and inadequate number of viral copies (<131 copies/mL). A negative result must be combined with  clinical observations, patient history, and epidemiological information. The expected result is Negative.  Fact Sheet for Patients:  PinkCheek.be  Fact Sheet for Healthcare Providers:  GravelBags.it  This test is no t yet approved or cleared by the [place] FDA and  has been authorized for detection and/or diagnosis of SARS-CoV-2 by FDA under an Emergency Use Authorization (EUA). This EUA will remain  in effect (meaning this test can be used) for the duration of the COVID-19 declaration under Section 564(b)(1) of the Act, 21 U.S.C. section 360bbb-3(b)(1), unless the authorization is terminated or revoked sooner.     Influenza A by PCR NEGATIVE NEGATIVE Final   Influenza B by PCR NEGATIVE NEGATIVE Final    Comment: (NOTE) The Xpert  Xpress SARS-CoV-2/FLU/RSV assay is intended as an aid in  the diagnosis of influenza from Nasopharyngeal swab specimens and  should not be used as a sole basis for treatment. Nasal washings and  aspirates are unacceptable for Xpert Xpress SARS-CoV-2/FLU/RSV  testing.  Fact Sheet for Patients: PinkCheek.be  Fact Sheet for Healthcare Providers: GravelBags.it  This test is not yet approved or cleared by the [place] FDA and  has been authorized for detection and/or diagnosis of SARS-CoV-2 by  FDA under an Emergency Use Authorization (EUA). This EUA will remain  in effect (meaning this test can be used) for the duration of the  Covid-19 declaration under Section 564(b)(1) of the Act, 21  U.S.C. section 360bbb-3(b)(1), unless the authorization is  terminated or revoked. Performed at [facility], [place] [address] [place], [place] [address]      Radiological Exams on Admission: CT HEAD WO CONTRAST  Result Date: 10/08/2020 CLINICAL DATA:  Un witnessed fall, altered mental status EXAM: CT HEAD WITHOUT CONTRAST CT CERVICAL  SPINE WITHOUT CONTRAST TECHNIQUE: Multidetector CT imaging of the head and cervical spine was performed following the standard protocol without intravenous contrast. Multiplanar CT image reconstructions of the cervical spine were also generated. COMPARISON:  None. FINDINGS: CT HEAD FINDINGS Brain: Normal anatomic configuration. No abnormal intra or extra-axial mass lesion or fluid collection. No abnormal mass effect or midline shift. No evidence of acute intracranial hemorrhage or infarct. Ventricular size is normal. Cerebellum unremarkable. Vascular: Unremarkable Skull: Intact Sinuses/Orbits: Paranasal sinuses are clear. Orbits are unremarkable. Other: Mastoid air cells and middle ear cavities are clear. CT CERVICAL SPINE FINDINGS Alignment: Mild straightening.  No listhesis. Skull base and vertebrae: The craniocervical junction is unremarkable. Atlantal dental interval is normal. No acute fracture of the cervical spine. Subchondral sclerosis at C4-5 and C5-6 is related to changes of degenerative disc disease. No suspicious lytic or blastic bone lesions are seen. Soft tissues and spinal canal: No prevertebral fluid or swelling. No visible canal hematoma. Disc levels: There is intervertebral disc space narrowing and endplate remodeling of W3-U8 in keeping with changes of moderate degenerative disc disease. Vertebral body height has been preserved. The spinal canal is widely patent. Multilevel uncovertebral and facet arthrosis results in multilevel neural foraminal narrowing, most severe on the left at C3-4, the right at C4-5, the left at C5-6, and the right at C6-7. Upper chest: Asymmetric changes of moderate to severe emphysema. Other: None signified IMPRESSION: No acute intracranial injury.  No calvarial fracture. No acute fracture or listhesis of the cervical spine. Multilevel degenerative changes resulting in multilevel neural foraminal narrowing. Severe emphysema, incompletely evaluated. Emphysema  (ICD10-J43.9). Electronically Signed   By: Fidela Salisbury MD   On: 10/08/2020 23:59   CT CERVICAL SPINE WO CONTRAST  Result Date: 10/08/2020 CLINICAL DATA:  Un witnessed fall, altered mental status EXAM: CT HEAD WITHOUT CONTRAST CT CERVICAL SPINE WITHOUT CONTRAST TECHNIQUE: Multidetector CT imaging of the head and cervical spine was performed following the standard protocol without intravenous contrast. Multiplanar CT image reconstructions of the cervical spine were also generated. COMPARISON:  None. FINDINGS: CT HEAD FINDINGS Brain: Normal anatomic configuration. No abnormal intra or extra-axial mass lesion or fluid collection. No abnormal mass effect or midline shift. No evidence of acute intracranial hemorrhage or infarct. Ventricular size is normal. Cerebellum unremarkable. Vascular: Unremarkable Skull: Intact Sinuses/Orbits: Paranasal sinuses are clear. Orbits are unremarkable. Other: Mastoid air cells and middle ear cavities are clear. CT CERVICAL SPINE FINDINGS Alignment: Mild straightening.  No  listhesis. Skull base and vertebrae: The craniocervical junction is unremarkable. Atlantal dental interval is normal. No acute fracture of the cervical spine. Subchondral sclerosis at C4-5 and C5-6 is related to changes of degenerative disc disease. No suspicious lytic or blastic bone lesions are seen. Soft tissues and spinal canal: No prevertebral fluid or swelling. No visible canal hematoma. Disc levels: There is intervertebral disc space narrowing and endplate remodeling of Z1-I4 in keeping with changes of moderate degenerative disc disease. Vertebral body height has been preserved. The spinal canal is widely patent. Multilevel uncovertebral and facet arthrosis results in multilevel neural foraminal narrowing, most severe on the left at C3-4, the right at C4-5, the left at C5-6, and the right at C6-7. Upper chest: Asymmetric changes of moderate to severe emphysema. Other: None signified IMPRESSION: No acute  intracranial injury.  No calvarial fracture. No acute fracture or listhesis of the cervical spine. Multilevel degenerative changes resulting in multilevel neural foraminal narrowing. Severe emphysema, incompletely evaluated. Emphysema (ICD10-J43.9). Electronically Signed   By: Fidela Salisbury MD   On: 10/08/2020 23:59   CT ABDOMEN PELVIS W CONTRAST  Result Date: 10/09/2020 CLINICAL DATA:  Abdominal pain, nonlocalized, altered mental status, sepsis EXAM: CT ABDOMEN AND PELVIS WITH CONTRAST TECHNIQUE: Multidetector CT imaging of the abdomen and pelvis was performed using the standard protocol following bolus administration of intravenous contrast. CONTRAST:  169m OMNIPAQUE IOHEXOL 300 MG/ML  SOLN COMPARISON:  CT 06/14/2020 FINDINGS: Lower chest: Emphysematous changes in the lung bases. Some bandlike areas of opacity likely subsegmental atelectasis or scarring. Lung bases otherwise clear. Normal heart size. No pericardial effusion. Coronary artery atherosclerosis is present. Calcifications also present upon the aortic leaflets. Hepatobiliary: Some heterogeneous hepatic enhancement, possibly related to contrast timing. No focal liver lesion is seen. Smooth liver surface contour. Diffuse periportal edema noted throughout the liver. Prior cholecystectomy. Prominence of the biliary tree possibly reflecting a combination of senescent change and post cholecystectomy reservoir effect. No visible calcified intraductal gallstones. Pancreas: No pancreatic ductal dilatation or surrounding inflammatory changes. Spleen: Splenomegaly, similar to prior. No concerning splenic lesions. Adrenals/Urinary Tract: Normal adrenal glands. Kidneys are normally located with symmetric enhancement and excretion. No suspicious renal lesion, urolithiasis or hydronephrosis. Question slight bladder wall thickening and mild perivesicular hazy stranding. 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  Fluid-filled appearance of the distal small bowel is nonspecific. No significant wall thickening or distention is seen. No resulting obstruction at this level or elsewhere. Small sliding-type hiatal hernia. Vascular/Lymphatic: Atherosclerotic calcifications within the abdominal aorta and branch vessels. No aneurysm or ectasia. Edematous changes throughout the mesentery with difficult visualization of the abdominopelvic lymph nodes. No worrisome adenopathy is evident. Reproductive: Patient appears to be post hysterectomy. No concerning adnexal lesion. Other: Diffuse edematous changes throughout the mesentery likely with small volume free fluid in the abdomen and pelvis. Diffuse body wall edema noted as well. Right lower quadrant ostomy. Musculoskeletal: Stable superior endplate L4 compression deformity. Grade 1 anterolisthesis L4 on L5 is unchanged from prior. Bone island in S1 is also stable. No acute osseous abnormality or suspicious osseous lesion. IMPRESSION: 1. 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 Fluid-filled appearance of the distal small bowel is nonspecific, but can be seen with enteritis. No significant wall thickening or distention is seen. 2. Question slight bladder wall thickening and mild perivesicular hazy stranding. Correlate with urinalysis to exclude cystitis. 3. Diffuse edematous changes throughout the mesentery likely with small volume free fluid in the abdomen and pelvis.  Diffuse body wall edema noted as well. Correlate for volume third-spacing/anasarca. 4. Periportal edema is present, can be seen in the setting of volume overload versus intrinsic liver disease. Correlate with LFTs. 5. Splenomegaly, similar to prior. 6. Prior cholecystectomy.  Likely mild reservoir effect. 7. Aortic Atherosclerosis (ICD10-I70.0). These results were called by telephone at the time of interpretation on 10/09/2020 at 3:30 am to provider Ripley Fraise , who verbally acknowledged  these results. Electronically Signed   By: Lovena Le M.D.   On: 10/09/2020 03:31   DG Chest Port 1 View  Result Date: 10/08/2020 CLINICAL DATA:  Shortness of breath EXAM: PORTABLE CHEST 1 VIEW COMPARISON:  September 10, 2020 FINDINGS: The heart size and mediastinal contours are within normal limits. Both lungs are clear. The visualized skeletal structures are unremarkable. IMPRESSION: No active disease. Electronically Signed   By: Prudencio Pair M.D.   On: 10/08/2020 23:59    EKG: Independently reviewed.  Normal sinus rhythm incomplete right bundle branch block.  Assessment/Plan Principal Problem:   Acute encephalopathy Active Problems:   COPD (chronic obstructive pulmonary disease) ([facility])   Ulcerative colitis ([place])    1. Acute encephalopathy with acute respiratory failure with hypercapnia likely causing patient's symptoms.  Suspect patient symptoms may be related to medication will need to get full history of patient medications or COPD.  Urine drug screen is positive for benzodiazepine.  Not sure when patient was started on this.  Since patient is mildly febrile blood cultures obtained and started on empiric antibiotics.  I am going to repeat patient's ABG again and if it still shows respiratory acidosis we will keep patient on BiPAP and consult pulmonary critical care.  If patient mental status does not improve may have to consider lumbar puncture and MRI.  Will check EEG.  But no definite signs of any active seizures. 2. History of COPD not actively wheezing we will keep patient on nebulizer for now repeat ABG. 3. History of ulcerative colitis with colostomy CT scan of the abdomen pelvis shows features concerning for enteritis.  We will continue to monitor closely. 4. Acute renal failure with mild hyperkalemia.  Repeat labs.  We will need to get further history when patient's family is available I was unable to reach any of the family member because there was no contact number and only  contact number was patient's friend and was not able to be reached.  I have ordered repeat labs including metabolic panel CBC ABG CK troponin TSH.   Since patient has acute hypercarbic respiratory failure with lethargy and encephalopathy will need close monitoring for any further worsening in inpatient status.  DVT prophylaxis: SCDs for now. Code Status: Full code. Family Communication: Unable to reach family. Disposition Plan: To be determined. Consults called: Pulmonary critical care. Admission status: Inpatient.   Rise Patience MD [facility] Pager [phone].  If 7PM-7AM, please contact night-coverage [email or web address] Password [facility]  10/09/2020, 5:33 AM

## 2020-10-09 NOTE — Progress Notes (Signed)
RT placed patient on BIPAP due to ABG results. Patient tolerating BIPAP well at this time. RT will monitor as needed.

## 2020-10-09 NOTE — Progress Notes (Signed)
Completed Sepsis Bundle Monitoring

## 2020-10-09 NOTE — ED Provider Notes (Signed)
[facility] DEPART[facility] Provider Note   CSN: [id number] Arrival date & time: 10/08/20  2304     History Chief Complaint  Patient presents with  . Altered Mental Status  Level 5 caveat due to altered mental status  Katelyn Villegas is a 59 y.o. female.  The history is provided by the EMS personnel and the patient.  Altered Mental Status Presenting symptoms: lethargy   Severity:  Moderate Timing:  Constant Progression:  Worsening Chronicity:  New Patient presents via EMS from home.  Patient was found on the floor by her roommate in the fetal position not answering questions.  Patient has been seen the previous day at her baseline. Not much is known about her baseline other than she takes chronic pain medication     PMH-unknown Soc hx - unknown OB History   No obstetric history on file.     No family history on file.  Social History   Tobacco Use  . Smoking status: Not on file  Substance Use Topics  . Alcohol use: Not on file  . Drug use: Not on file    Home Medications Prior to Admission medications   Not on File    Allergies    Patient has no allergy information on record.  Review of Systems   Review of Systems  Unable to perform ROS: Mental status change    Physical Exam Updated Vital Signs BP 102/72 (BP Location: Right Arm)   Pulse 89   Temp (!) 100.6 F (38.1 C) (Oral)   Ht 1.626 m (5' 4" )   Wt 49.9 kg   SpO2 91%   BMI 18.88 kg/m   Physical Exam  CONSTITUTIONAL: Disheveled, lying on her right side in the fetal position HEAD: Normocephalic/atraumatic, no signs of trauma EYES: EOMI/PERRL, no nystagmus, pupils not pinpoint ENMT: Mucous membranes dry NECK: supple no meningeal signs SPINE/BACK no bruising/crepitance/stepoffs noted to spine CV: S1/S2 noted, no murmurs/rubs/gallops noted LUNGS: Coarse breath sounds bilaterally, tachypnea ABDOMEN: soft, nontender, colostomy in place NEURO: Pt is somnolent.  She will  follow commands and answer some questions but appears confused at times. EXTREMITIES: pulses normal/equal, full ROM, no obvious deformities.  Pelvis stable SKIN: warm, color normal PSYCH: Unable to assess  ED Results / Procedures / Treatments   Labs (all labs ordered are listed, but only abnormal results are displayed) Labs Reviewed  COMPREHENSIVE METABOLIC PANEL - Abnormal; Notable for the following components:      Result Value   Potassium 5.3 (*)    Glucose, Bld 106 (*)    BUN 28 (*)    Creatinine, Ser 1.14 (*)    Total Protein 6.3 (*)    GFR, Estimated 55 (*)    All other components within normal limits  CBC WITH DIFFERENTIAL/PLATELET - Abnormal; Notable for the following components:   MCHC 29.3 (*)    Lymphs Abs 0.4 (*)    All other components within normal limits  URINALYSIS, COMPLETE (UACMP) WITH MICROSCOPIC - Abnormal; Notable for the following components:   Bacteria, UA RARE (*)    All other components within normal limits  SALICYLATE LEVEL - Abnormal; Notable for the following components:   Salicylate Lvl <9.6 (*)    All other components within normal limits  ACETAMINOPHEN LEVEL - Abnormal; Notable for the following components:   Acetaminophen (Tylenol), Serum <10 (*)    All other components within normal limits  I-STAT VENOUS BLOOD GAS, ED - Abnormal; Notable for the following components:  pCO2, Ven 69.5 (*)    pO2, Ven 55.0 (*)    Bicarbonate 30.7 (*)    TCO2 33 (*)    Hemoglobin 15.3 (*)    All other components within normal limits  I-STAT ARTERIAL BLOOD GAS, ED - Abnormal; Notable for the following components:   pH, Arterial 7.293 (*)    pCO2 arterial 58.1 (*)    pO2, Arterial 61 (*)    Bicarbonate 28.4 (*)    All other components within normal limits  RESPIRATORY PANEL BY RT PCR (FLU A&B, COVID)  URINE CULTURE  CULTURE, BLOOD (ROUTINE X 2)  CULTURE, BLOOD (ROUTINE X 2)  AMMONIA  PROTIME-INR  LACTIC ACID, PLASMA  CK  ETHANOL  BLOOD GAS, VENOUS    LACTIC ACID, PLASMA  RAPID URINE DRUG SCREEN, HOSP PERFORMED  BLOOD GAS, ARTERIAL  LIPASE, BLOOD  CBG MONITORING, ED    EKG EKG Interpretation  Date/Time:  Tuesday October 09 2020 00:15:36 EST Ventricular Rate:  87 PR Interval:  132 QRS Duration: 100 QT Interval:  366 QTC Calculation: 440 R Axis:   -84 Text Interpretation: Normal sinus rhythm Incomplete right bundle branch block Left anterior fascicular block Minimal voltage criteria for LVH, may be normal variant ( Cornell product ) T wave abnormality, consider anterior ischemia Abnormal ECG No previous ECGs available Confirmed by Ripley Fraise [phone]) on 10/09/2020 12:35:37 AM   Radiology CT HEAD WO CONTRAST  Result Date: 10/08/2020 CLINICAL DATA:  Un witnessed fall, altered mental status EXAM: CT HEAD WITHOUT CONTRAST CT CERVICAL SPINE WITHOUT CONTRAST TECHNIQUE: Multidetector CT imaging of the head and cervical spine was performed following the standard protocol without intravenous contrast. Multiplanar CT image reconstructions of the cervical spine were also generated. COMPARISON:  None. FINDINGS: CT HEAD FINDINGS Brain: Normal anatomic configuration. No abnormal intra or extra-axial mass lesion or fluid collection. No abnormal mass effect or midline shift. No evidence of acute intracranial hemorrhage or infarct. Ventricular size is normal. Cerebellum unremarkable. Vascular: Unremarkable Skull: Intact Sinuses/Orbits: Paranasal sinuses are clear. Orbits are unremarkable. Other: Mastoid air cells and middle ear cavities are clear. CT CERVICAL SPINE FINDINGS Alignment: Mild straightening.  No listhesis. Skull base and vertebrae: The craniocervical junction is unremarkable. Atlantal dental interval is normal. No acute fracture of the cervical spine. Subchondral sclerosis at C4-5 and C5-6 is related to changes of degenerative disc disease. No suspicious lytic or blastic bone lesions are seen. Soft tissues and spinal canal: No  prevertebral fluid or swelling. No visible canal hematoma. Disc levels: There is intervertebral disc space narrowing and endplate remodeling of L9-J5 in keeping with changes of moderate degenerative disc disease. Vertebral body height has been preserved. The spinal canal is widely patent. Multilevel uncovertebral and facet arthrosis results in multilevel neural foraminal narrowing, most severe on the left at C3-4, the right at C4-5, the left at C5-6, and the right at C6-7. Upper chest: Asymmetric changes of moderate to severe emphysema. Other: None signified IMPRESSION: No acute intracranial injury.  No calvarial fracture. No acute fracture or listhesis of the cervical spine. Multilevel degenerative changes resulting in multilevel neural foraminal narrowing. Severe emphysema, incompletely evaluated. Emphysema (ICD10-J43.9). Electronically Signed   By: Fidela Salisbury MD   On: 10/08/2020 23:59   CT CERVICAL SPINE WO CONTRAST  Result Date: 10/08/2020 CLINICAL DATA:  Un witnessed fall, altered mental status EXAM: CT HEAD WITHOUT CONTRAST CT CERVICAL SPINE WITHOUT CONTRAST TECHNIQUE: Multidetector CT imaging of the head and cervical spine was performed following the standard protocol without  intravenous contrast. Multiplanar CT image reconstructions of the cervical spine were also generated. COMPARISON:  None. FINDINGS: CT HEAD FINDINGS Brain: Normal anatomic configuration. No abnormal intra or extra-axial mass lesion or fluid collection. No abnormal mass effect or midline shift. No evidence of acute intracranial hemorrhage or infarct. Ventricular size is normal. Cerebellum unremarkable. Vascular: Unremarkable Skull: Intact Sinuses/Orbits: Paranasal sinuses are clear. Orbits are unremarkable. Other: Mastoid air cells and middle ear cavities are clear. CT CERVICAL SPINE FINDINGS Alignment: Mild straightening.  No listhesis. Skull base and vertebrae: The craniocervical junction is unremarkable. Atlantal dental  interval is normal. No acute fracture of the cervical spine. Subchondral sclerosis at C4-5 and C5-6 is related to changes of degenerative disc disease. No suspicious lytic or blastic bone lesions are seen. Soft tissues and spinal canal: No prevertebral fluid or swelling. No visible canal hematoma. Disc levels: There is intervertebral disc space narrowing and endplate remodeling of U9-N2 in keeping with changes of moderate degenerative disc disease. Vertebral body height has been preserved. The spinal canal is widely patent. Multilevel uncovertebral and facet arthrosis results in multilevel neural foraminal narrowing, most severe on the left at C3-4, the right at C4-5, the left at C5-6, and the right at C6-7. Upper chest: Asymmetric changes of moderate to severe emphysema. Other: None signified IMPRESSION: No acute intracranial injury.  No calvarial fracture. No acute fracture or listhesis of the cervical spine. Multilevel degenerative changes resulting in multilevel neural foraminal narrowing. Severe emphysema, incompletely evaluated. Emphysema (ICD10-J43.9). Electronically Signed   By: Fidela Salisbury MD   On: 10/08/2020 23:59   CT ABDOMEN PELVIS W CONTRAST  Result Date: 10/09/2020 CLINICAL DATA:  Abdominal pain, nonlocalized, altered mental status, sepsis EXAM: CT ABDOMEN AND PELVIS WITH CONTRAST TECHNIQUE: Multidetector CT imaging of the abdomen and pelvis was performed using the standard protocol following bolus administration of intravenous contrast. CONTRAST:  150m OMNIPAQUE IOHEXOL 300 MG/ML  SOLN COMPARISON:  CT 06/14/2020 FINDINGS: Lower chest: Emphysematous changes in the lung bases. Some bandlike areas of opacity likely subsegmental atelectasis or scarring. Lung bases otherwise clear. Normal heart size. No pericardial effusion. Coronary artery atherosclerosis is present. Calcifications also present upon the aortic leaflets. Hepatobiliary: Some heterogeneous hepatic enhancement, possibly related to  contrast timing. No focal liver lesion is seen. Smooth liver surface contour. Diffuse periportal edema noted throughout the liver. Prior cholecystectomy. Prominence of the biliary tree possibly reflecting a combination of senescent change and post cholecystectomy reservoir effect. No visible calcified intraductal gallstones. Pancreas: No pancreatic ductal dilatation or surrounding inflammatory changes. Spleen: Splenomegaly, similar to prior. No concerning splenic lesions. Adrenals/Urinary Tract: Normal adrenal glands. Kidneys are normally located with symmetric enhancement and excretion. No suspicious renal lesion, urolithiasis or hydronephrosis. Question slight bladder wall thickening and mild perivesicular hazy stranding. 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 Fluid-filled appearance of the distal small bowel is nonspecific. No significant wall thickening or distention is seen. No resulting obstruction at this level or elsewhere. Small sliding-type hiatal hernia. Vascular/Lymphatic: Atherosclerotic calcifications within the abdominal aorta and branch vessels. No aneurysm or ectasia. Edematous changes throughout the mesentery with difficult visualization of the abdominopelvic lymph nodes. No worrisome adenopathy is evident. Reproductive: Patient appears to be post hysterectomy. No concerning adnexal lesion. Other: Diffuse edematous changes throughout the mesentery likely with small volume free fluid in the abdomen and pelvis. Diffuse body wall edema noted as well. Right lower quadrant ostomy. Musculoskeletal: Stable superior endplate L4 compression deformity. Grade 1 anterolisthesis L4 on L5 is unchanged  from prior. Bone island in S1 is also stable. No acute osseous abnormality or suspicious osseous lesion. IMPRESSION: 1. 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 Fluid-filled appearance of the distal small bowel is nonspecific, but can  be seen with enteritis. No significant wall thickening or distention is seen. 2. Question slight bladder wall thickening and mild perivesicular hazy stranding. Correlate with urinalysis to exclude cystitis. 3. Diffuse edematous changes throughout the mesentery likely with small volume free fluid in the abdomen and pelvis. Diffuse body wall edema noted as well. Correlate for volume third-spacing/anasarca. 4. Periportal edema is present, can be seen in the setting of volume overload versus intrinsic liver disease. Correlate with LFTs. 5. Splenomegaly, similar to prior. 6. Prior cholecystectomy.  Likely mild reservoir effect. 7. Aortic Atherosclerosis (ICD10-I70.0). These results were called by telephone at the time of interpretation on 10/09/2020 at 3:30 am to provider Ripley Fraise , who verbally acknowledged these results. Electronically Signed   By: Lovena Le M.D.   On: 10/09/2020 03:31   DG Chest Port 1 View  Result Date: 10/08/2020 CLINICAL DATA:  Shortness of breath EXAM: PORTABLE CHEST 1 VIEW COMPARISON:  September 10, 2020 FINDINGS: The heart size and mediastinal contours are within normal limits. Both lungs are clear. The visualized skeletal structures are unremarkable. IMPRESSION: No active disease. Electronically Signed   By: Prudencio Pair M.D.   On: 10/08/2020 23:59    Procedures .Critical Care Performed by: Ripley Fraise, MD Authorized by: Ripley Fraise, MD   Critical care provider statement:    Critical care time (minutes):  60   Critical care start time:  10/09/2020 12:30 AM   Critical care end time:  10/09/2020 1:30 AM   Critical care time was exclusive of:  Separately billable procedures and treating other patients   Critical care was necessary to treat or prevent imminent or life-threatening deterioration of the following conditions:  Sepsis, respiratory failure and CNS failure or compromise   Critical care was time spent personally by me on the following activities:   Development of treatment plan with patient or surrogate, discussions with consultants, re-evaluation of patient's condition, pulse oximetry, ordering and review of radiographic studies, ordering and review of laboratory studies and ordering and performing treatments and interventions   I assumed direction of critical care for this patient from another provider in my specialty: no      Medications Ordered in ED Medications  lactated ringers infusion ( Intravenous New Bag/Given 10/09/20 0105)  lactated ringers bolus 1,000 mL (0 mLs Intravenous Stopped 10/09/20 0220)    And  lactated ringers bolus 500 mL (has no administration in time range)  vancomycin (VANCOCIN) IVPB 1000 mg/200 mL premix (has no administration in time range)  ceFEPIme (MAXIPIME) 2 g in sodium chloride 0.9 % 100 mL IVPB (has no administration in time range)  vancomycin (VANCOREADY) IVPB 750 mg/150 mL (has no administration in time range)  ceFEPIme (MAXIPIME) 2 g in sodium chloride 0.9 % 100 mL IVPB (0 g Intravenous Stopped 10/09/20 0115)  iohexol (OMNIPAQUE) 300 MG/ML solution 100 mL (100 mLs Intravenous Contrast Given 10/09/20 0252)    ED Course  I have reviewed the triage vital signs and the nursing notes.  Pertinent labs & imaging results that were available during my care of the patient were reviewed by me and considered in my medical decision making (see chart for details).    MDM Rules/Calculators/A&P  12:32 AM Patient presents from home for altered mental status of unclear etiology.  Patient was found on the floor by her roommate, last known well the previous day. It is reported the patient takes pain medications, and review of narcotic database reveals patient has prescriptions for Valium and Suboxone Patient is ill-appearing.  She is tachypneic.  She initially was nonverbal but after stimulation would wake up and start answering questions.  She still appears mildly confused.  There is  no obvious signs of traumatic injury to her body. However patient is febrile require extensive work-up and admission We will follow closely. 2:40 AM Overall work-up is unrevealing at this time. Patient appears to be more alert, resting comfortably.  She has been placed on oxygen. She reports she hurts all over.  She reports chronic back pain.  On repeat exam she does have diffuse moderate abdominal tenderness with presence of colostomy.  It is unclear how acute her abdominal pain is.  Will obtain CT abdomen pelvis and then admit. Otherwise since patient's mental status is improving I have a lower suspicion for meningitis at this time. 3:42 AM Discussed with radiology about CT head and pelvis.  No acute surgical findings, but does have diffuse edema.  Likely from IV fluids  Patient has been stabilized emergency department.  Discussed with Dr. Hal Hope for admission Refill her mental status is improved.  Suspect some of her mental status changes earlier were due to suboxone/valium   This patient presents to the ED for concern of altered mental status, this involves an extensive number of treatment options, and is a complaint that carries with it a high risk of complications and morbidity.  The differential diagnosis includes meningitis, overdose, electrolyte derangement, urinary tract infection, pneumonia, stroke, intracranial hemorrhage   Lab Tests:   I Ordered, reviewed, and interpreted labs, which included electrolytes, complete blood count, lactic acid, drug screens, urinalysis, CK  Medicines ordered:   I ordered medication IV fluids for dehydration  Imaging Studies ordered:   I ordered imaging studies which included CT head, CT C-spine, chest x-ray   I independently visualized and interpreted imaging which showed no acute findings    Consultations Obtained:   I consulted [facility] hospitalist Dr. Hal Hope and discussed lab and imaging findings  Reevaluation:  After the  interventions stated above, I reevaluated the patient and found patient is improved   Final Clinical Impression(s) / ED Diagnoses Final diagnoses:  Somnolence  Dehydration  Hyperkalemia    Rx / DC Orders ED Discharge Orders    None       Ripley Fraise, MD 10/09/20 [phone]

## 2020-10-09 NOTE — Progress Notes (Signed)
PROGRESS NOTE  Katelyn Villegas  DOB: Oct 23, 1961  PCP: Patient, No Pcp Per [id number]  DOA: 10/08/2020  LOS: 0 days   Chief Complaint  Patient presents with  . Altered Mental Status    Brief narrative: Katelyn Villegas is a 59 y.o. female who was brought to the ED on 10/08/2020 after found on the floor by her friend.   PMH significant for COPD, ulcerative colitis with colostomy.  Unknown downtime.  Last seen normal by her friend about 24 hours prior.    In the ED, patient was lethargic, had a fever of 101.4. ABG showed pH of 7.29 PCO2 of 58.1.   Chest x-ray was unremarkable.   Labs are significant for creatinine 1.1 potassium of 5.3, unremarkable CBC, normal ammonia.  CT head and C-spine were unremarkable.  CT abdomen pelvis showed features concerning for fluid overload versus enteritis and cystitis. UDS positive for benzos, patient takes Valium at home. Urine shows bacteria and is otherwise unremarkable.  Subjective: Patient was seen and examined this morning in the ED.  Patient was on BiPAP.  She opens eyes on verbal command but fell right back to sleep. Chart reviewed. Last episode of fever was 100.6 last night. Repeat blood gas this morning showed worsening pH 7.22 and worsening PCO2 73 and hence patient was started on BiPAP.  Repeat blood gas after that showed a collection of CO2 level to 55.  Assessment/Plan: Acute hypercapnic respiratory failure COPD exacerbation -Initial blood gas with PCO2 58 which subsequently worsened to 73 this morning.  Started on BiPAP. -Continue BiPAP for next 24 hours with breaks. -Repeat blood gas in a.m. -pulmonary consult appreciated.  Started on desonide twice daily, Yupelri daily and Brovana twice daily.  Acute metabolic encephalopathy -Likely secondary to hypercapnia.  Patient also takes Valium at home, unclear how long she has been using it.  -Continue to monitor mental status change.  If no improvement, may need lumbar puncture and MRI  as well. -EEG ordered.  Patient might have had unwitnessed seizure as well.  SIRS   -Fever of 101.4, tachypnea, no clear source of infection. -Currently on empiric antibiotics. Recent Labs  Lab 10/08/20 2314 10/08/20 2335 10/09/20 0409 10/09/20 0553  WBC 6.0  --   --  6.2  LATICACIDVEN  --  1.6 1.6  --    History of ulcerative colitis with colostomy  -CT scan of the abdomen pelvis showed features concerning for enteritis.  We will continue to monitor closely. -No abdominal symptoms.  Acute renal failure with mild hyperkalemia.   -Improving. Recent Labs    10/08/20 2314 10/09/20 0553  BUN 28* 23  CREATININE 1.14* 0.95   Recent Labs  Lab 10/09/20 0049 10/09/20 0136 10/09/20 0553 10/09/20 0604 10/09/20 0820  K 5.1 4.9 4.6 4.5 4.5   low blood sugar no history of diabetes mellitus -blood sugar low at 66 this morning.  No history of diabetes mellitus.  Obtain A1c.  At risk of further hypoglycemia.  Poor oral intake.  Start on dextrose drip at 30 mill per hour with every 4hrs sliding scale insulin.  Mobility: PT eval once mental status improves. Code Status:   Code Status: Full Code  Nutritional status: Body mass index is 18.88 kg/m.     Diet Order            Diet NPO time specified  Diet effective now                 DVT prophylaxis: SCDs Start: 10/09/20  7829   Antimicrobials:  IV cefepime, IV vancomycin Fluid: D5 drip at 30 mill per hour Consultants: Pulmonary Family Communication:  Family not at bedside  Status is: Inpatient  Remains inpatient appropriate because: On BiPAP, remains altered   Dispo: The patient is from: Home              Anticipated d/c is to: Home versus SNF, pending PT eval              Anticipated d/c date is: Unclear at this time              Patient currently is not medically stable to d/c.       Infusions:  . ceFEPime (MAXIPIME) IV 2 g (10/09/20 1058)  . dextrose    . vancomycin      Scheduled Meds: .  arformoterol  15 mcg Nebulization BID  . budesonide (PULMICORT) nebulizer solution  0.25 mg Nebulization BID  . insulin aspart  0-9 Units Subcutaneous Q4H  . revefenacin  175 mcg Nebulization Daily    Antimicrobials: Anti-infectives (From admission, onward)   Start     Dose/Rate Route Frequency Ordered Stop   10/10/20 0100  vancomycin (VANCOREADY) IVPB 750 mg/150 mL  Status:  Discontinued        750 mg 150 mL/hr over 60 Minutes Intravenous Every 24 hours 10/09/20 0131 10/09/20 0748   10/10/20 0000  ceFEPIme (MAXIPIME) 2 g in sodium chloride 0.9 % 100 mL IVPB  Status:  Discontinued        2 g 200 mL/hr over 30 Minutes Intravenous Every 24 hours 10/09/20 0131 10/09/20 0748   10/09/20 1800  vancomycin (VANCOREADY) IVPB 500 mg/100 mL        500 mg 100 mL/hr over 60 Minutes Intravenous Every 12 hours 10/09/20 0748     10/09/20 1000  ceFEPIme (MAXIPIME) 2 g in sodium chloride 0.9 % 100 mL IVPB        2 g 200 mL/hr over 30 Minutes Intravenous Every 12 hours 10/09/20 0748     10/08/20 2345  ceFEPIme (MAXIPIME) 2 g in sodium chloride 0.9 % 100 mL IVPB        2 g 200 mL/hr over 30 Minutes Intravenous  Once 10/08/20 2336 10/09/20 0115   10/08/20 2345  vancomycin (VANCOCIN) IVPB 1000 mg/200 mL premix        1,000 mg 200 mL/hr over 60 Minutes Intravenous  Once 10/08/20 2336 10/09/20 0658      PRN meds: acetaminophen **OR** acetaminophen, albuterol   Objective: Vitals:   10/09/20 0930 10/09/20 1030  BP: 123/79 109/77  Pulse: 80 80  Resp: 16 16  Temp:    SpO2: 100% 100%   No intake or output data in the 24 hours ending 10/09/20 1119 Filed Weights   10/08/20 2319  Weight: 49.9 kg   Weight change:  Body mass index is 18.88 kg/m.   Physical Exam: General exam: On BiPAP, not in physical distress Skin: No rashes, lesions or ulcers. HEENT: Atraumatic, normocephalic, supple neck, no obvious bleeding Lungs: Clear to auscultation bilaterally CVS: Regular rate and rhythm, no  murmur GI/Abd soft, nontender, nondistended, bowel sound present, ostomy bag with stool. CNS: Opens eyes on verbal command, falls right back asleep.   Psychiatry: Mood appropriate Extremities: No pedal edema, no calf tenderness  Data Review: I have personally reviewed the laboratory data and studies available.  Recent Labs  Lab 10/08/20 2314 10/08/20 2314 10/09/20 0049 10/09/20 0136 10/09/20 5621 10/09/20 3086  10/09/20 0820  WBC 6.0  --   --   --  6.2  --   --   NEUTROABS 5.2  --   --   --  4.9  --   --   HGB 13.4   < > 15.3* 13.6 12.2 12.6 12.6  HCT 45.8   < > 45.0 40.0 42.2 37.0 37.0  MCV 97.7  --   --   --  97.7  --   --   PLT 167  --   --   --  168  --   --    < > = values in this interval not displayed.   Recent Labs  Lab 10/08/20 2314 10/08/20 2314 10/09/20 0049 10/09/20 0136 10/09/20 0553 10/09/20 0604 10/09/20 0820  NA 142   < > 142 143 142 142 142  K 5.3*   < > 5.1 4.9 4.6 4.5 4.5  CL 104  --   --   --  107  --   --   CO2 23  --   --   --  27  --   --   GLUCOSE 106*  --   --   --  132*  --   --   BUN 28*  --   --   --  23  --   --   CREATININE 1.14*  --   --   --  0.95  --   --   CALCIUM 9.1  --   --   --  8.3*  --   --    < > = values in this interval not displayed.    F/u labs ordered.  Signed, Terrilee Croak, MD [facility] 10/09/2020

## 2020-10-09 NOTE — ED Notes (Signed)
Pt continues to remove her Bipap. RT at the bedside to obtain ABG.

## 2020-10-10 DIAGNOSIS — J9602 Acute respiratory failure with hypercapnia: Secondary | ICD-10-CM | POA: Diagnosis not present

## 2020-10-10 LAB — CBC WITH DIFFERENTIAL/PLATELET
Abs Immature Granulocytes: 0.01 10*3/uL (ref 0.00–0.07)
Basophils Absolute: 0 10*3/uL (ref 0.0–0.1)
Basophils Relative: 0 %
Eosinophils Absolute: 0 10*3/uL (ref 0.0–0.5)
Eosinophils Relative: 1 %
HCT: 36.4 % (ref 36.0–46.0)
Hemoglobin: 11.1 g/dL — ABNORMAL LOW (ref 12.0–15.0)
Immature Granulocytes: 0 %
Lymphocytes Relative: 17 %
Lymphs Abs: 0.8 10*3/uL (ref 0.7–4.0)
MCH: 28.6 pg (ref 26.0–34.0)
MCHC: 30.5 g/dL (ref 30.0–36.0)
MCV: 93.8 fL (ref 80.0–100.0)
Monocytes Absolute: 0.6 10*3/uL (ref 0.1–1.0)
Monocytes Relative: 12 %
Neutro Abs: 3.4 10*3/uL (ref 1.7–7.7)
Neutrophils Relative %: 70 %
Platelets: 152 10*3/uL (ref 150–400)
RBC: 3.88 MIL/uL (ref 3.87–5.11)
RDW: 14 % (ref 11.5–15.5)
WBC: 4.9 10*3/uL (ref 4.0–10.5)
nRBC: 0 % (ref 0.0–0.2)

## 2020-10-10 LAB — BLOOD GAS, ARTERIAL
Acid-Base Excess: 5.6 mmol/L — ABNORMAL HIGH (ref 0.0–2.0)
Bicarbonate: 31 mmol/L — ABNORMAL HIGH (ref 20.0–28.0)
Drawn by: 60057
FIO2: 28
O2 Saturation: 97.6 %
Patient temperature: 37
pCO2 arterial: 58.2 mmHg — ABNORMAL HIGH (ref 32.0–48.0)
pH, Arterial: 7.346 — ABNORMAL LOW (ref 7.350–7.450)
pO2, Arterial: 96.1 mmHg (ref 83.0–108.0)

## 2020-10-10 LAB — BASIC METABOLIC PANEL
Anion gap: 4 — ABNORMAL LOW (ref 5–15)
BUN: 19 mg/dL (ref 8–23)
CO2: 29 mmol/L (ref 22–32)
Calcium: 8.3 mg/dL — ABNORMAL LOW (ref 8.9–10.3)
Chloride: 104 mmol/L (ref 98–111)
Creatinine, Ser: 0.7 mg/dL (ref 0.44–1.00)
GFR, Estimated: >60 mL/min (ref >60)
Glucose, Bld: 91 mg/dL (ref 70–99)
Potassium: 4 mmol/L (ref 3.5–5.1)
Sodium: 137 mmol/L (ref 135–145)

## 2020-10-10 LAB — GLUCOSE, CAPILLARY
Glucose-Capillary: 62 mg/dL — ABNORMAL LOW (ref 70–99)
Glucose-Capillary: 87 mg/dL (ref 70–99)
Glucose-Capillary: 82 mg/dL (ref 70–99)
Glucose-Capillary: 143 mg/dL — ABNORMAL HIGH (ref 70–99)
Glucose-Capillary: 129 mg/dL — ABNORMAL HIGH (ref 70–99)

## 2020-10-10 LAB — HIV ANTIBODY (ROUTINE TESTING W REFLEX): HIV Screen 4th Generation wRfx: NONREACTIVE

## 2020-10-10 MED ORDER — DIAZEPAM 5 MG PO TABS
10.0000 mg | ORAL_TABLET | Freq: Two times a day (BID) | ORAL | Status: DC
  Administered 2020-10-10 – 2020-10-12 (×4): 10 mg via ORAL
  Filled 2020-10-10 (×4): qty 2

## 2020-10-10 MED ORDER — BUPRENORPHINE HCL-NALOXONE HCL 8-2 MG SL SUBL
1.0000 | SUBLINGUAL_TABLET | Freq: Two times a day (BID) | SUBLINGUAL | Status: DC
  Administered 2020-10-10 – 2020-10-12 (×5): 1 via SUBLINGUAL
  Filled 2020-10-10 (×5): qty 1
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
## 2020-10-10 NOTE — Progress Notes (Signed)
Pt complaining of severe chronic back pain throughout the day. Suboxone twice a day ordered by MD. Tylenol PRN Q6h ordered per MD.

## 2020-10-10 NOTE — Progress Notes (Signed)
Hypoglycemic Event  CBG: 62 @0736   Treatment: 153m orange juice   Symptoms: none noted  Follow-up CBG: Time:0804  CBG Result:87  Possible Reasons for Event: lethargic, poor PO intake  Comments/MD notified:N/A    Katelyn Villegas PLuz Lex

## 2020-10-10 NOTE — Therapy (Signed)
Pt refused cpap unit no unit in room

## 2020-10-10 NOTE — Consult Note (Signed)
NAME:  Katelyn Villegas, MRN:  [id number], DOB:  1961-06-08, LOS: 1 ADMISSION DATE:  10/08/2020, CONSULTATION DATE:  10/09/2020 REFERRING MD:  Gean Birchwood, MD CHIEF COMPLAINT:  Encephalopathy   Brief History   Katelyn Villegas is a 59 year old woman with history of COPD, ulcerative colitis with colostomy who was brought to the ER on 11/15 after being found on the floor by her friend. She is being admitted for hypercapnic respiratory failure and possible enteritis and cystitis.  History of present illness   59 year old woman, daily smoker with COPD and ulcerative colitis s/p colostomy who was found on the floor on 11/15 with last check in about 24 hours before. She is somnolent but responsive to verbal stimuli. She has been placed on Bipap therapy for hypercapnic respiratory failure pH 7.22, pCO2 73.1, pO2 114. CT head is negative for acute intracranial injuries and no acute cervical spine injuries. Significant emphysema noted on upper lung fields. CT Abd/Pelvis also noted emphysematous changed at the lung bases. Fluid-filled distal small bowel concerning for possible enteritis and slight bladder wall thickening and mild perivesicular hazy stranding concerning for cystitis.    Past Medical History  Ulcerative Colitis COPD/Emphysema Tobacco Use  Significant Hospital Events   Bipap started 11/16  Consults:  PCCM  Procedures:    Significant Diagnostic Tests:  CT head & C-Spine - No acute injuries noted. Degenerative disc disease.   CT Abd/Pelvis 11/16: 1. 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 Fluid-filled appearance of the distal small bowel is nonspecific, but can be seen with enteritis. No significant wall thickening or distention is seen. 2. Question slight bladder wall thickening and mild perivesicular hazy stranding. Correlate with urinalysis to exclude cystitis. 3. Diffuse edematous changes throughout the mesentery likely with small volume  free fluid in the abdomen and pelvis. Diffuse body wall edema noted as well. Correlate for volume third-spacing/anasarca. 4. Periportal edema is present, can be seen in the setting of volume overload versus intrinsic liver disease. Correlate with LFTs. 5. Splenomegaly, similar to prior.  Chest X-ray 11/15 No opacities or pleural effusions.   Micro Data:  11/16 Bld Cx>> 11/16 Influenza/Covid PCR - Negative   Antimicrobials:  Cefepime 11/16>> Vancomycin 11/16>>  Interim history/subjective:  No acute events overnight. Patient did not wear bipap overnight.  ABG this AM: pH 7.346, pCO2 58, pO2 96.  Objective   Blood pressure 121/79, pulse 70, temperature 98.1 F (36.7 C), temperature source Oral, resp. rate 19, height 5' 4"  (1.626 m), weight 50 kg, SpO2 98 %.    Vent Mode: PCV;BIPAP FiO2 (%):  [3 %-30 %] 28 % Set Rate:  [15 bmp] 15 bmp   Intake/Output Summary (Last 24 hours) at 10/10/2020 1216 Last data filed at 10/10/2020 1100 Gross per 24 hour  Intake 292.65 ml  Output 800 ml  Net -507.35 ml   Filed Weights   10/08/20 2319 10/10/20 0143  Weight: 49.9 kg 50 kg    Examination: General: chronically ill appearing woman, no acute distress HENT: moist mucous membranes, sclera anicteric Lungs: No wheezing, rhonchi or crackles Cardiovascular: RRR, distant S1S2. No murmurs Abdomen: soft, non-tender, ostomy bag in place  Extremities: no edema, warm Neuro: moving all extremities, Alert and oriented x 3  Resolved Hospital Problem list     Assessment & Plan:  Acute Hypercapnic Respiratory Failure In setting of underlying COPD/Emphysema, daily tobacco use and sedating medications along with possible acute infection but unclear source at this time. - Continue budesonide  BID, yupleri daily and brovana BID nebulizer treatments - We O2 as able - No further requirement for bipap or cpap at night - She can follow up with her PCP and outpatient pulmonologist - Recommend she  continue on a combination of inhaled corticosteroid, long acting beta agonist and long acting antimuscarinic agent until follow. Trelegy or Breztri inhalers are good options.  Pulmonary will sign off. Please call with any further questions  Labs   CBC: Recent Labs  Lab 10/08/20 2314 10/09/20 0049 10/09/20 0136 10/09/20 0553 10/09/20 0604 10/09/20 0820 10/10/20 0051  WBC 6.0  --   --  6.2  --   --  4.9  NEUTROABS 5.2  --   --  4.9  --   --  3.4  HGB 13.4   < > 13.6 12.2 12.6 12.6 11.1*  HCT 45.8   < > 40.0 42.2 37.0 37.0 36.4  MCV 97.7  --   --  97.7  --   --  93.8  PLT 167  --   --  168  --   --  152   < > = values in this interval not displayed.    Basic Metabolic Panel: Recent Labs  Lab 10/08/20 2314 10/09/20 0049 10/09/20 0136 10/09/20 0553 10/09/20 0604 10/09/20 0820 10/10/20 0051  NA 142   < > 143 142 142 142 137  K 5.3*   < > 4.9 4.6 4.5 4.5 4.0  CL 104  --   --  107  --   --  104  CO2 23  --   --  27  --   --  29  GLUCOSE 106*  --   --  132*  --   --  91  BUN 28*  --   --  23  --   --  19  CREATININE 1.14*  --   --  0.95  --   --  0.70  CALCIUM 9.1  --   --  8.3*  --   --  8.3*   < > = values in this interval not displayed.   GFR: Estimated Creatinine Clearance: 58.3 mL/min (by C-G formula based on SCr of 0.7 mg/dL). Recent Labs  Lab 10/08/20 2314 10/08/20 2335 10/09/20 0409 10/09/20 0553 10/10/20 0051  PROCALCITON  --   --   --  0.13  --   WBC 6.0  --   --  6.2 4.9  LATICACIDVEN  --  1.6 1.6  --   --     Liver Function Tests: Recent Labs  Lab 10/08/20 2314 10/09/20 0553  AST 18 17  ALT 11 11  ALKPHOS 105 82  BILITOT 0.5 0.5  PROT 6.3* 4.8*  ALBUMIN 3.6 2.7*   Recent Labs  Lab 10/09/20 0449  LIPASE 25   Recent Labs  Lab 10/08/20 2314 10/09/20 0553  AMMONIA 18 11    ABG    Component Value Date/Time   PHART 7.346 (L) 10/10/2020 0540   PCO2ART 58.2 (H) 10/10/2020 0540   PO2ART 96.1 10/10/2020 0540   HCO3 31.0 (H) 10/10/2020  0540   TCO2 31 10/09/2020 0820   O2SAT 97.6 10/10/2020 0540     Coagulation Profile: Recent Labs  Lab 10/08/20 2314  INR 1.1    Cardiac Enzymes: Recent Labs  Lab 10/09/20 0029 10/09/20 0553  CKTOTAL 61 40    HbA1C: Hgb A1c MFr Bld  Date/Time Value Ref Range Status  10/09/2020 05:53 AM 5.2 4.8 - 5.6 % Final  Comment:    (NOTE) Pre diabetes:          5.7%-6.4%  Diabetes:              >6.4%  Glycemic control for   <7.0% adults with diabetes     CBG: Recent Labs  Lab 10/09/20 2015 10/09/20 2134 10/10/20 0736 10/10/20 0804 10/10/20 1151  GLUCAP 142* 107* 62* 87 82    Freda Jackson, MD [facility] Office: [phone]   See Amion for Pager Details

## 2020-10-10 NOTE — Progress Notes (Signed)
Pharmacy Antibiotic Note  Katelyn Villegas is a 59 y.o. female admitted on 10/08/2020 with sepsis.  Pharmacy has been consulted 11/16 for vancomycin and cefepime dosing. Vancomycin 1gm and cefepime 2gm ordered in ED.    WBC wnl , afeb, Tm 99.4  SCr improved 1.14 >0.95>0.7,  Estimated CrCl ~58 ml/min.  Vancomycin and Cefepime doses were adjusted appropriately on 11/16  Micro:  11/15 BCx: no growth to date 11/15 UCx:  not found 11/16 Covid neg/ flu neg    Plan: Cefepime increased to 2gm IV q12 hours Vancomycin increased to 539m q12 hours F/u renal function, cultures and clinical course  Height: 5' 4"  (162.6 cm) Weight: 50 kg (110 lb 3.7 oz) IBW/kg (Calculated) : 54.7  Temp (24hrs), Avg:98.6 F (37 C), Min:98 F (36.7 C), Max:99.4 F (37.4 C)  Recent Labs  Lab 10/08/20 2314 10/08/20 2335 10/09/20 0409 10/09/20 0553 10/10/20 0051  WBC 6.0  --   --  6.2 4.9  CREATININE 1.14*  --   --  0.95 0.70  LATICACIDVEN  --  1.6 1.6  --   --     Estimated Creatinine Clearance: 58.3 mL/min (by C-G formula based on SCr of 0.7 mg/dL).    Allergies  Allergen Reactions  . Morphine     Unknown reaction   . Procaine Other (See Comments)    BREATHING ISSUE   . Aspirin     ULCERS   . Sulfa Antibiotics Itching  . Sulfamethoxazole Other (See Comments)    Unknown reaction  . Meperidine Rash  . Penicillins Itching    Tolerated cephalosporins     Thank you for allowing pharmacy to be a part of this patient's care.  RNicole Cella RPh Clinical Pharmacist [phone] check AMION for all MPenndelphone numbers After 10:00 PM, call [phone] 10/10/2020 11:03 AM

## 2020-10-11 LAB — GLUCOSE, CAPILLARY
Glucose-Capillary: 90 mg/dL (ref 70–99)
Glucose-Capillary: 79 mg/dL (ref 70–99)
Glucose-Capillary: 112 mg/dL — ABNORMAL HIGH (ref 70–99)
Glucose-Capillary: 160 mg/dL — ABNORMAL HIGH (ref 70–99)
Glucose-Capillary: 92 mg/dL (ref 70–99)

## 2020-10-11 MED ORDER — PANTOPRAZOLE SODIUM 40 MG PO TBEC
40.0000 mg | DELAYED_RELEASE_TABLET | Freq: Two times a day (BID) | ORAL | Status: DC
  Administered 2020-10-11 – 2020-10-12 (×3): 40 mg via ORAL
  Filled 2020-10-11 (×3): qty 1

## 2020-10-11 MED ORDER — CEFDINIR 300 MG PO CAPS
300.0000 mg | ORAL_CAPSULE | Freq: Two times a day (BID) | ORAL | Status: DC
  Administered 2020-10-11 – 2020-10-12 (×2): 300 mg via ORAL
  Filled 2020-10-11 (×2): qty 1

## 2020-10-11 MED ORDER — ONDANSETRON HCL 4 MG/2ML IJ SOLN
4.0000 mg | Freq: Four times a day (QID) | INTRAMUSCULAR | Status: DC | PRN
  Administered 2020-10-11 – 2020-10-12 (×3): 4 mg via INTRAVENOUS
  Filled 2020-10-11 (×3): qty 2
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
## 2020-10-11 NOTE — Evaluation (Addendum)
Physical Therapy Evaluation Patient Details Name: Katelyn Villegas MRN: [id number] DOB: 05/15/1961 Today's Date: 10/11/2020   History of Present Illness  Pt is a 59 y.o. female admitted 10/08/20 after found down by friend for unknown time (last seen by same friend about 24-hrs prior). Abdominal CT concerning for enteritis. Workup for acute metabolic encephalopathy. PMH includes COPD, ulcerative colitis with colostomy.    Clinical Impression  Pt presents with an overall decrease in functional mobility secondary to above. PTA, pt reports independent with mobility, recently living with roommate but does not plan to d/c there. Today, pt able to perform transfers, ambulation and ADL tasks with intermittent min guard for balance; c/o some dizziness with standing. Pt limited by pain, generalized weakness and decreased activity tolerance. Will follow acutely to address established goals.    Post-activity BP 103/78 HR 80s-90s SpO2 91% on RA   Follow Up Recommendations No PT follow up    Equipment Recommendations   Rollator   Recommendations for Other Services       Precautions / Restrictions Precautions Precautions: Fall Precaution Comments: Colostomy Restrictions Weight Bearing Restrictions: No      Mobility  Bed Mobility Overal bed mobility: Modified Independent             General bed mobility comments: HOB elevated    Transfers Overall transfer level: Needs assistance Equipment used: None Transfers: Sit to/from Stand Sit to Stand: Supervision         General transfer comment: Supervision for safety/lines  Ambulation/Gait Ambulation/Gait assistance: Supervision;Min guard Gait Distance (Feet): 40 Feet Assistive device: None Gait Pattern/deviations: Step-through pattern;Decreased stride length;Antalgic Gait velocity: Decreased   General Gait Details: Slow, mildly unsteady gait without DME, pt reaching to furniture for UE support, intermittent min guard for balance;  further distance limited by pain/fatigue after prolonged standing at sink for ADL tasks  Stairs            Wheelchair Mobility    Modified Rankin (Stroke Patients Only)       Balance Overall balance assessment: Needs assistance   Sitting balance-Leahy Scale: Good Sitting balance - Comments: Indep with pericare while seated on BSC     Standing balance-Leahy Scale: Fair Standing balance comment: Prolonged standing at sink for ADL tasks (brushing gums, washing face, washing hands); dynamic stability improved with UE support                             Pertinent Vitals/Pain Pain Assessment: Faces Faces Pain Scale: Hurts little more Pain Location: BLEs, feet, lower back Pain Descriptors / Indicators: Dull;Discomfort;Constant Pain Intervention(s): Monitored during session;Limited activity within patient's tolerance;Patient requesting pain meds-RN notified    Home Living Family/patient expects to be discharged to:: Shelter/Homeless                      Prior Function Level of Independence: Independent         Comments: Ambulates without DME. Homeless, but recently living with roommate she does not plan to d/c home with     Hand Dominance        Extremity/Trunk Assessment   Upper Extremity Assessment Upper Extremity Assessment: Generalized weakness    Lower Extremity Assessment Lower Extremity Assessment: Generalized weakness       Communication   Communication: No difficulties  Cognition Arousal/Alertness: Awake/alert Behavior During Therapy: WFL for tasks assessed/performed;Flat affect Overall Cognitive Status: No family/caregiver present to determine baseline cognitive functioning  General Comments: WFL for majority of tasks, although pt randomly asking unrelated question and difficult to redirect      General Comments General comments (skin integrity, edema, etc.): Discussed  recommendation for rollator - pt interested - will trial next session    Exercises     Assessment/Plan    PT Assessment Patient needs continued PT services  PT Problem List Decreased strength;Decreased activity tolerance;Decreased balance;Decreased mobility;Decreased knowledge of use of DME;Pain       PT Treatment Interventions DME instruction;Gait training;Stair training;Functional mobility training;Therapeutic activities;Therapeutic exercise;Balance training;Patient/family education    PT Goals (Current goals can be found in the Care Plan section)  Acute Rehab PT Goals Patient Stated Goal: Decreased pain PT Goal Formulation: With patient Time For Goal Achievement: 10/25/20 Potential to Achieve Goals: Good    Frequency Min 3X/week   Barriers to discharge Decreased caregiver support;Inaccessible home environment      Co-evaluation               AM-PAC PT "6 Clicks" Mobility  Outcome Measure Help needed turning from your back to your side while in a flat bed without using bedrails?: None Help needed moving from lying on your back to sitting on the side of a flat bed without using bedrails?: None Help needed moving to and from a bed to a chair (including a wheelchair)?: None Help needed standing up from a chair using your arms (e.g., wheelchair or bedside chair)?: None Help needed to walk in hospital room?: A Little Help needed climbing 3-5 steps with a railing? : A Little 6 Click Score: 22    End of Session Equipment Utilized During Treatment: Gait belt Activity Tolerance: Patient tolerated treatment well;Patient limited by fatigue;Patient limited by pain Patient left: in chair;with call bell/phone within reach Nurse Communication: Mobility status;Patient requests pain meds (RN (McKenzie) reports pt ok without chair alarm) PT Visit Diagnosis: Other abnormalities of gait and mobility (R26.89);Pain    Time: 1515-1540 PT Time Calculation (min) (ACUTE ONLY): 25  min   Charges:   PT Evaluation $PT Eval Moderate Complexity: 1 Mod PT Treatments $Therapeutic Activity: 8-22 mins   Mabeline Caras, PT, DPT Acute Rehabilitation Services  Pager [phone] Office [place] 10/11/2020, 4:03 PM

## 2020-10-11 NOTE — Progress Notes (Signed)
PROGRESS NOTE  Katelyn Villegas  DOB: May 01, 1961  PCP: Patient, No Pcp Per YJE:[id number]  DOA: 10/08/2020  LOS: 2 days   Chief Complaint  Patient presents with  . Altered Mental Status    Brief narrative: Katelyn Villegas is a 59 y.o. female who was brought to the ED on 10/08/2020 after found on the floor by her friend.   PMH significant for COPD, ulcerative colitis with colostomy.  Unknown downtime.  Last seen normal by her friend about 24 hours prior.    In the ED, patient was lethargic, had a fever of 101.4. ABG showed pH of 7.29 PCO2 of 58.1.   Chest x-ray was unremarkable.   Labs are significant for creatinine 1.1 potassium of 5.3, unremarkable CBC, normal ammonia.  CT head and C-spine were unremarkable.  CT abdomen pelvis showed features concerning for fluid overload versus enteritis and cystitis. UDS positive for benzos, patient takes Valium at home. Urine shows bacteria and is otherwise unremarkable.  Subjective: Patient was seen and examined this morning. Lying on bed.  Complaining of Villegas.  She was waiting for her Villegas medicine this morning. Was off oxygen for several hours yesterday.  On 2 L this morning.  Continue weaning  Assessment/Plan: Acute metabolic encephalopathy -Patient takes Suboxone and Valium at home. It is possible that she might have overdosed herself and went unconscious.  However patient denies doing that.  -Her PCO2 level was elevated with good be the primary cause of encephalopathy versus a result of respiratory depression from drug overdose.  -In any case, mental status is improved now back to baseline.  -Recommended to minimize the use of mood altering medications.  Acute hypercapnic respiratory failure COPD exacerbation -PCO2 level max at 73.  Improved with BiPAP, patient is on supplemental oxygen 2 to 3 L by nasal cannula this morning.  Does not use oxygen at home.   -On 2 L oxygen this morning.  Wean down as tolerated. -pulmonary consult  appreciated.  Bronchodilators started.  Chronic Villegas, chronic anxiety -Chronically on Suboxone and Valium at home.  Patient follows up with a Villegas management specialist as an outpatient -Denies overdosing on those medications.   -Continue both medicines.  SIRS   -On admission, patient had a fever of 101.4, tachypnea, no clear source of infection. -Currently on empiric IV cefepime. -No growth in culture.  Switch to oral Omnicef to complete 5-day course.   Recent Labs  Lab 10/08/20 2314 10/08/20 2335 10/09/20 0409 10/09/20 0553 10/10/20 0051  WBC 6.0  --   --  6.2 4.9  LATICACIDVEN  --  1.6 1.6  --   --   PROCALCITON  --   --   --  0.13  --    History of ulcerative colitis with colostomy  -CT scan of the abdomen pelvis showed features concerning for enteritis. No abdominal symptoms however. We will continue to monitor closely. -Continue PPI, Zofran as needed  Acute renal failure with mild hyperkalemia.   -Improved with IV fluid.  IV fluid stopped. Recent Labs    10/08/20 2314 10/09/20 0553 10/10/20 0051  BUN 28* 23 19  CREATININE 1.14* 0.95 0.70   Recent Labs  Lab 10/09/20 0136 10/09/20 0553 10/09/20 0604 10/09/20 0820 10/10/20 0051  K 4.9 4.6 4.5 4.5 4.0   Low blood sugar no history of diabetes mellitus -blood sugar was running low because of which she was started on dextrose drip.   -A1c 5.2.  Oral appetite has improved.  Dextrose drip off.  Mobility: PT eval ordered. Code Status:   Code Status: Full Code  Nutritional status: Body mass index is 18.92 kg/m.     Diet Order            Diet regular Room service appropriate? No; Fluid consistency: Thin  Diet effective now                 DVT prophylaxis: SCDs Start: 10/09/20 0531   Antimicrobials:  Oral Omnicef Fluid: None Consultants: Pulmonary Family Communication:  Family not at bedside  Status is: Inpatient  Remains inpatient appropriate because: On BiPAP, remains altered   Dispo: The  patient is from: Home              Anticipated d/c is to: Home versus SNF, pending PT eval              Anticipated d/c date is: Unclear at this time              Patient currently is not medically stable to d/c.   Infusions:    Scheduled Meds: . arformoterol  15 mcg Nebulization BID  . budesonide (PULMICORT) nebulizer solution  0.25 mg Nebulization BID  . buprenorphine-naloxone  1 tablet Sublingual BID  . cefdinir  300 mg Oral Q12H  . diazepam  10 mg Oral BID  . pantoprazole  40 mg Oral BID AC  . revefenacin  175 mcg Nebulization Daily    Antimicrobials: Anti-infectives (From admission, onward)   Start     Dose/Rate Route Frequency Ordered Stop   10/11/20 2200  cefdinir (OMNICEF) capsule 300 mg        300 mg Oral Every 12 hours 10/11/20 1041 10/14/20 2159   10/10/20 0100  vancomycin (VANCOREADY) IVPB 750 mg/150 mL  Status:  Discontinued        750 mg 150 mL/hr over 60 Minutes Intravenous Every 24 hours 10/09/20 0131 10/09/20 0748   10/10/20 0000  ceFEPIme (MAXIPIME) 2 g in sodium chloride 0.9 % 100 mL IVPB  Status:  Discontinued        2 g 200 mL/hr over 30 Minutes Intravenous Every 24 hours 10/09/20 0131 10/09/20 0748   10/09/20 1800  vancomycin (VANCOREADY) IVPB 500 mg/100 mL  Status:  Discontinued        500 mg 100 mL/hr over 60 Minutes Intravenous Every 12 hours 10/09/20 0748 10/10/20 1506   10/09/20 1000  ceFEPIme (MAXIPIME) 2 g in sodium chloride 0.9 % 100 mL IVPB  Status:  Discontinued        2 g 200 mL/hr over 30 Minutes Intravenous Every 12 hours 10/09/20 0748 10/11/20 1041   10/08/20 2345  ceFEPIme (MAXIPIME) 2 g in sodium chloride 0.9 % 100 mL IVPB        2 g 200 mL/hr over 30 Minutes Intravenous  Once 10/08/20 2336 10/09/20 0115   10/08/20 2345  vancomycin (VANCOCIN) IVPB 1000 mg/200 mL premix        1,000 mg 200 mL/hr over 60 Minutes Intravenous  Once 10/08/20 2336 10/09/20 0658      PRN meds: acetaminophen **OR** acetaminophen, albuterol, ondansetron  (ZOFRAN) IV   Objective: Vitals:   10/11/20 0815 10/11/20 0949  BP: 135/81   Pulse: 73   Resp: 14   Temp: 98 F (36.7 C)   SpO2: 92% 94%    Intake/Output Summary (Last 24 hours) at 10/11/2020 1041 Last data filed at 10/11/2020 0412 Gross per 24 hour  Intake 635.6 ml  Output 1350 ml  Net -714.4 ml   Filed Weights   10/08/20 2319 10/10/20 0143  Weight: 49.9 kg 50 kg   Weight change:  Body mass index is 18.92 kg/m.   Physical Exam: General exam: Mentions it hurts everywhere because he has a regular Villegas medicine this morning. Skin: No rashes, lesions or ulcers. HEENT: Atraumatic, normocephalic, supple neck, no obvious bleeding Lungs: Clear to auscultation bilaterally CVS: Regular rate and rhythm, no murmur GI/Abd soft, nontender, nondistended, bowel sound present CNS: Alert, awake, oriented x3 Psychiatry: Mood appropriate Extremities: No pedal edema, no calf tenderness  Data Review: I have personally reviewed the laboratory data and studies available.  Recent Labs  Lab 10/08/20 2314 10/09/20 0049 10/09/20 0136 10/09/20 0553 10/09/20 0604 10/09/20 0820 10/10/20 0051  WBC 6.0  --   --  6.2  --   --  4.9  NEUTROABS 5.2  --   --  4.9  --   --  3.4  HGB 13.4   < > 13.6 12.2 12.6 12.6 11.1*  HCT 45.8   < > 40.0 42.2 37.0 37.0 36.4  MCV 97.7  --   --  97.7  --   --  93.8  PLT 167  --   --  168  --   --  152   < > = values in this interval not displayed.   Recent Labs  Lab 10/08/20 2314 10/09/20 0049 10/09/20 0136 10/09/20 0553 10/09/20 0604 10/09/20 0820 10/10/20 0051  NA 142   < > 143 142 142 142 137  K 5.3*   < > 4.9 4.6 4.5 4.5 4.0  CL 104  --   --  107  --   --  104  CO2 23  --   --  27  --   --  29  GLUCOSE 106*  --   --  132*  --   --  91  BUN 28*  --   --  23  --   --  19  CREATININE 1.14*  --   --  0.95  --   --  0.70  CALCIUM 9.1  --   --  8.3*  --   --  8.3*   < > = values in this interval not displayed.    F/u labs  ordered.  Signed, Terrilee Croak, MD [facility] 10/11/2020

## 2020-10-12 LAB — GLUCOSE, CAPILLARY
Glucose-Capillary: 73 mg/dL (ref 70–99)
Glucose-Capillary: 80 mg/dL (ref 70–99)
Glucose-Capillary: 80 mg/dL (ref 70–99)
Glucose-Capillary: 86 mg/dL (ref 70–99)
Glucose-Capillary: 99 mg/dL (ref 70–99)

## 2020-10-12 MED ORDER — CEFDINIR 300 MG PO CAPS: 300.0000 mg | ORAL_CAPSULE | Freq: Two times a day (BID) | ORAL | 0 refills | Status: AC

## 2020-10-12 MED ORDER — DIAZEPAM 10 MG PO TABS: 10.0000 mg | ORAL_TABLET | Freq: Two times a day (BID) | ORAL | 0 refills | Status: DC

## 2020-10-12 MED ORDER — DIAZEPAM 10 MG PO TABS: 10.0000 mg | ORAL_TABLET | Freq: Two times a day (BID) | ORAL | 0 refills | Status: AC

## 2020-10-12 MED ORDER — DEXLANSOPRAZOLE 60 MG PO CPDR: 60.0000 mg | DELAYED_RELEASE_CAPSULE | Freq: Every day | ORAL | 0 refills | Status: AC

## 2020-10-12 MED ORDER — ONDANSETRON HCL 4 MG PO TABS: 4.0000 mg | ORAL_TABLET | Freq: Three times a day (TID) | ORAL | 0 refills | Status: DC | PRN

## 2020-10-12 MED ORDER — CEFDINIR 300 MG PO CAPS: 300.0000 mg | ORAL_CAPSULE | Freq: Two times a day (BID) | ORAL | 0 refills | Status: DC

## 2020-10-12 MED ORDER — DEXLANSOPRAZOLE 60 MG PO CPDR: 60.0000 mg | DELAYED_RELEASE_CAPSULE | Freq: Every day | ORAL | 0 refills | Status: DC

## 2020-10-12 MED ORDER — BUPRENORPHINE HCL-NALOXONE HCL 8-2 MG SL SUBL: 1.0000 | SUBLINGUAL_TABLET | Freq: Two times a day (BID) | SUBLINGUAL | 0 refills | Status: AC

## 2020-10-12 NOTE — TOC Progression Note (Signed)
Transition of Care [facility]) - Progression Note    Patient Details  Name: Katelyn Villegas MRN: [id number] Date of Birth: [birth year]  [facility] CM/SW [place], LCSW Phone Number: 10/12/2020, 12:19 PM  Clinical Narrative:    CSW contacted [place], [employer], [place], but left vociemails.    Expected Discharge Plan: Homeless Shelter Barriers to Discharge: Continued Medical Work up  Expected Discharge Plan and Services Expected Discharge Plan: Homeless Shelter In-house Referral: Clinical Social Work     Living arrangements for the past 2 months: Apartment Expected Discharge Date: 10/12/20               DME Arranged: Gilford Rile rolling with seat DME Agency: [employer] Date DME Agency Contacted: 10/12/20 Time DME Agency Contacted: [id number] Representative spoke with at DME Agency: [place] ([place]) Interventions    Readmission Risk Interventions No flowsheet data found.

## 2020-10-12 NOTE — TOC Progression Note (Signed)
Transition of Care [facility]) - Progression Note    Patient Details  Name: Katelyn Villegas MRN: [id number] Date of Birth: 05-May-1961  Transition of Care [facility]) CM/SW [place], LCSW Phone Number: 10/12/2020, 4:09 PM  Clinical Narrative:    2pm-Patient still stating she cannot return to her prior living situation. CSW staffed case with [place]. She advised to send referral to [facility] for availability. CSW sent referral to [facility] for review, as she reported she does have Medicaid beds available. Otherwise, patient can be discharged to the [facility] on Monday to a winter shelter.   3pm-Per today's RN, patient's Ambien and Suboxone are in her purse and have not been stolen by the roommate. CSW will update APS.    Expected Discharge Plan: Homeless Shelter Barriers to Discharge: Continued Medical Work up  Expected Discharge Plan and Services Expected Discharge Plan: Homeless Shelter In-house Referral: Clinical Social Work     Living arrangements for the past 2 months: Apartment Expected Discharge Date: 10/12/20               DME Arranged: Gilford Rile rolling with seat DME Agency: [employer] Date DME Agency Contacted: 10/12/20 Time DME Agency Contacted: [id number] Representative spoke with at DME Agency: [place] ([place]) Interventions    Readmission Risk Interventions No flowsheet data found.

## 2020-10-12 NOTE — Progress Notes (Signed)
Physical Therapy Treatment Patient Details Name: Katelyn Villegas MRN: [id number] DOB: 1961-01-15 Today's Date: 10/12/2020    History of Present Illness Pt is a 59 y.o. female admitted 10/08/20 after found down by friend for unknown time (last seen by same friend about 24-hrs prior). Abdominal CT concerning for enteritis. Workup for acute metabolic encephalopathy. PMH includes COPD, ulcerative colitis with colostomy.   PT Comments    Pt's mobility limited this session secondary to leaking/detached ostomy bag, awaiting new supplies (RN/NT aware working on this). Session focused on rollator training via education and demonstration. Pt with poor attention, requiring frequent redirection to current task and conversation. Pt still unsure of exact d/c plans, working with CM/SW on this. If to remain admitted, will continue to follow acutely.    Follow Up Recommendations  No PT follow up     Equipment Recommendations   (rollator)    Recommendations for Other Services       Precautions / Restrictions Precautions Precautions: Fall Precaution Comments: Colostomy Restrictions Weight Bearing Restrictions: No    Mobility  Bed Mobility Overal bed mobility: Modified Independent             General bed mobility comments: Mod indep repositioning in bed - deferred OOB secondary to leaking/detached ostomy, awaiting new bag  Transfers                    Ambulation/Gait                 Stairs             Wheelchair Mobility    Modified Rankin (Stroke Patients Only)       Balance                                            Cognition Arousal/Alertness: Awake/alert Behavior During Therapy: WFL for tasks assessed/performed;Flat affect Overall Cognitive Status: No family/caregiver present to determine baseline cognitive functioning                                 General Comments: Noted poor attention, easily distracted and  switching conversation tasks, requiring cues to stay on task/focus on education      Exercises      General Comments General comments (skin integrity, edema, etc.): Pt with leaking/detached ostomy bag limiting OOB mobility - session focused on rollator education and demonstration; pt's new rollator delivered during session      Pertinent Vitals/Pain Pain Assessment: Faces Faces Pain Scale: Hurts a little bit Pain Location: Generalized Pain Descriptors / Indicators: Discomfort Pain Intervention(s): Monitored during session    Home Living                      Prior Function            PT Goals (current goals can now be found in the care plan section) Progress towards PT goals: Progressing toward goals    Frequency    Min 3X/week      PT Plan Current plan remains appropriate    Co-evaluation              AM-PAC PT "6 Clicks" Mobility   Outcome Measure  Help needed turning from your back to your side while in a flat bed without using bedrails?: None  Help needed moving from lying on your back to sitting on the side of a flat bed without using bedrails?: None Help needed moving to and from a bed to a chair (including a wheelchair)?: None Help needed standing up from a chair using your arms (e.g., wheelchair or bedside chair)?: None Help needed to walk in hospital room?: A Little Help needed climbing 3-5 steps with a railing? : A Little 6 Click Score: 22    End of Session   Activity Tolerance: Treatment limited secondary to medical complications (Comment) Patient left: in bed;with call bell/phone within reach;with nursing/sitter in room Nurse Communication: Mobility status PT Visit Diagnosis: Other abnormalities of gait and mobility (R26.89);Pain     Time: 1254-1315 PT Time Calculation (min) (ACUTE ONLY): 21 min  Charges:  $Self Care/Home Management: [place], PT, DPT Acute Rehabilitation Services  Pager  [phone] Office [place] 10/12/2020, 1:48 PM

## 2020-10-12 NOTE — NC FL2 (Signed)
  [place] LEVEL OF CARE SCREENING TOOL     IDENTIFICATION  Patient Name: Katelyn Villegas Birthdate: 08/29/1961 Sex: female Admission Date (Current Location): 10/08/2020  [facility] and [place] Number:  Herbalist and Address:  The . [facility], [place] [address], [place], [place] [address]      Provider Number: [id number]  Attending Physician Name and Address:  Katelyn Croak, MD  Relative Name and Phone Number:       Current Level of Care: Hospital Recommended Level of Care: ALF Prior Approval Number:    Date Approved/Denied:   PASRR Number:    Discharge Plan: Other (Comment) (ALF)    Current Diagnoses: Patient Active Problem List   Diagnosis Date Noted  . Acute encephalopathy 10/09/2020  . COPD (chronic obstructive pulmonary disease) ([place]) 10/09/2020  . Ulcerative colitis ([place]) 10/09/2020  . Acute respiratory failure with hypercapnia ([place]) 10/09/2020  . Acute respiratory failure ([place]) 10/09/2020    Orientation RESPIRATION BLADDER Height & Weight     Self, Time, Place  Normal Continent Weight: 96 lb 5.5 oz (43.7 kg) Height:  5' 4"  (162.6 cm)  BEHAVIORAL SYMPTOMS/MOOD NEUROLOGICAL BOWEL NUTRITION STATUS      Colostomy Diet (Regular)  AMBULATORY STATUS COMMUNICATION OF NEEDS Skin   Limited Assist Verbally Normal                       Personal Care Assistance Level of Assistance  Bathing, Feeding, Dressing Bathing Assistance: Limited assistance Feeding assistance: Independent Dressing Assistance: Limited assistance     Functional Limitations Info  Sight, Hearing, Speech Sight Info: Adequate Hearing Info: Adequate Speech Info: Adequate    SPECIAL CARE FACTORS FREQUENCY                       Contractures      Additional Factors Info  Allergies, Code Status, Psychotropic Code Status Info: FULL Allergies Info: Morphine, Asprin, Procaine, Sulfa Antibiotics, Sulfamethoxazole, Meperidine,  Penicillins Psychotropic Info: Valium, Suboxone         Current Medications (10/12/2020):   Discharge Medications:       TAKE these medications       albuterol 108 (90 Base) MCG/ACT inhaler Commonly known as: VENTOLIN HFA Inhale 1-2 puffs into the lungs every 6 (six) hours as needed for wheezing or shortness of breath.   buprenorphine-naloxone 8-2 mg Subl SL tablet Commonly known as: SUBOXONE Place 1 tablet under the tongue 2 (two) times daily.   cefdinir 300 MG capsule Commonly known as: OMNICEF Take 1 capsule (300 mg total) by mouth every 12 (twelve) hours for 3 days.   dexlansoprazole 60 MG capsule Commonly known as: DEXILANT Take 1 capsule (60 mg total) by mouth daily. What changed: when to take this   diazepam 10 MG tablet Commonly known as: VALIUM Take 1 tablet (10 mg total) by mouth 2 (two) times daily for 5 days.   ipratropium-albuterol 0.5-2.5 (3) MG/3ML Soln Commonly known as: DUONEB Take 3 mLs by nebulization 4 (four) times daily as needed (shortness of breath).   ondansetron 4 MG tablet Commonly known as: ZOFRAN Take 1 tablet (4 mg total) by mouth every 8 (eight) hours as needed for nausea or vomiting.      Relevant Imaging Results:  Relevant Lab Results:   Additional Information SSN: [place] Katelyn, Villegas

## 2020-10-12 NOTE — Discharge Summary (Addendum)
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 Terrilee Croak  [facility] 10/12/2020, 11:35 AM

## 2020-10-12 NOTE — Plan of Care (Signed)
Patient states she understand discharge instructions, and is ready to go home.

## 2020-10-12 NOTE — TOC Progression Note (Addendum)
Transition of Care [facility]) - Progression Note    Patient Details  Name: Katelyn Villegas MRN: [id number] Date of Birth: 01-01-1961  Transition of Care [facility]) CM/SW [place], RN Phone Number: 10/12/2020, 11:39 AM  Clinical Narrative:     Spoke to patient about housing. Cannot go back to living with Bailey Mech, States she has sister and daughter in [place], [place] go live with them Did not elaborate.  Has nowhere to go , stated she was scared,  Does want to go to [place]. Does not get any more money until December first. She has clothes where she was staying.  Reached out to [facility] to see if they could refer to anywhere for emergency shelter.  [facility] called back, she can go to [facility] on Monday and get a winter bed There is nothing that is available this weekend. [place] were both called.   rollator ordered for patient per PT recommendations.   Expected Discharge Plan: Homeless Shelter Barriers to Discharge: Continued Medical Work up  Expected Discharge Plan and Services Expected Discharge Plan: Homeless Shelter In-house Referral: Clinical Social Work     Living arrangements for the past 2 months: Apartment Expected Discharge Date: 10/12/20               DME Arranged: Gilford Rile rolling with seat DME Agency: [employer] Date DME Agency Contacted: 10/12/20 Time DME Agency Contacted: 1139 Representative spoke with at DME Agency: [place] ([employer]) Interventions    Readmission Risk Interventions No flowsheet data found.

## 2020-10-14 LAB — CULTURE, BLOOD (ROUTINE X 2)
Culture: NO GROWTH
Culture: NO GROWTH
Special Requests: ADEQUATE
Special Requests: ADEQUATE

## 2020-10-28 ENCOUNTER — Emergency Department (HOSPITAL_COMMUNITY)
Admission: EM | Admit: 2020-10-28 | Discharge: 2020-10-28 | Disposition: A | Discharge status: Home / Self Care | Payer: Medicaid Other | Attending: Emergency Medicine | Admitting: Emergency Medicine

## 2020-10-28 ENCOUNTER — Emergency Department (HOSPITAL_COMMUNITY): Payer: Medicaid Other

## 2020-10-28 ENCOUNTER — Other Ambulatory Visit: Payer: Self-pay

## 2020-10-28 DIAGNOSIS — J449 Chronic obstructive pulmonary disease, unspecified: Secondary | ICD-10-CM | POA: Insufficient documentation

## 2020-10-28 DIAGNOSIS — R0602 Shortness of breath: Secondary | ICD-10-CM | POA: Diagnosis present

## 2020-10-28 DIAGNOSIS — Z20822 Contact with and (suspected) exposure to covid-19: Secondary | ICD-10-CM | POA: Insufficient documentation

## 2020-10-28 DIAGNOSIS — Z79899 Other long term (current) drug therapy: Secondary | ICD-10-CM | POA: Insufficient documentation

## 2020-10-28 DIAGNOSIS — R059 Cough, unspecified: Secondary | ICD-10-CM | POA: Diagnosis not present

## 2020-10-28 LAB — COMPREHENSIVE METABOLIC PANEL
ALT: 6 U/L (ref 0–44)
AST: 17 U/L (ref 15–41)
Albumin: 3.4 g/dL — ABNORMAL LOW (ref 3.5–5.0)
Alkaline Phosphatase: 68 U/L (ref 38–126)
Anion gap: 8 (ref 5–15)
BUN: 10 mg/dL (ref 8–23)
CO2: 26 mmol/L (ref 22–32)
Calcium: 8 mg/dL — ABNORMAL LOW (ref 8.9–10.3)
Chloride: 106 mmol/L (ref 98–111)
Creatinine, Ser: 0.49 mg/dL (ref 0.44–1.00)
GFR, Estimated: >60 mL/min (ref >60)
Glucose, Bld: 62 mg/dL — ABNORMAL LOW (ref 70–99)
Potassium: 4.2 mmol/L (ref 3.5–5.1)
Sodium: 140 mmol/L (ref 135–145)
Total Bilirubin: 0.6 mg/dL (ref 0.3–1.2)
Total Protein: 5.5 g/dL — ABNORMAL LOW (ref 6.5–8.1)

## 2020-10-28 LAB — CBC WITH DIFFERENTIAL/PLATELET
Abs Immature Granulocytes: 0.01 10*3/uL (ref 0.00–0.07)
Basophils Absolute: 0 10*3/uL (ref 0.0–0.1)
Basophils Relative: 1 %
Eosinophils Absolute: 0.3 10*3/uL (ref 0.0–0.5)
Eosinophils Relative: 5 %
HCT: 39.7 % (ref 36.0–46.0)
Hemoglobin: 12.4 g/dL (ref 12.0–15.0)
Immature Granulocytes: 0 %
Lymphocytes Relative: 10 %
Lymphs Abs: 0.5 10*3/uL — ABNORMAL LOW (ref 0.7–4.0)
MCH: 28.7 pg (ref 26.0–34.0)
MCHC: 31.2 g/dL (ref 30.0–36.0)
MCV: 91.9 fL (ref 80.0–100.0)
Monocytes Absolute: 0.4 10*3/uL (ref 0.1–1.0)
Monocytes Relative: 8 %
Neutro Abs: 3.6 10*3/uL (ref 1.7–7.7)
Neutrophils Relative %: 76 %
Platelets: 129 10*3/uL — ABNORMAL LOW (ref 150–400)
RBC: 4.32 MIL/uL (ref 3.87–5.11)
RDW: 15.6 % — ABNORMAL HIGH (ref 11.5–15.5)
WBC: 4.8 10*3/uL (ref 4.0–10.5)
nRBC: 0 % (ref 0.0–0.2)

## 2020-10-28 LAB — RESP PANEL BY RT-PCR (FLU A&B, COVID) ARPGX2
Influenza A by PCR: NEGATIVE
Influenza B by PCR: NEGATIVE
SARS Coronavirus 2 by RT PCR: NEGATIVE

## 2020-10-28 MED ORDER — OXYCODONE-ACETAMINOPHEN 5-325 MG PO TABS
2.0000 | ORAL_TABLET | Freq: Once | ORAL | Status: AC
  Administered 2020-10-28: 2 via ORAL
  Filled 2020-10-28: qty 2

## 2020-10-28 NOTE — ED Notes (Signed)
Pt walk great with her 02 was around 98-100 without oxygen on but she complains of SOB.

## 2020-10-28 NOTE — ED Provider Notes (Signed)
[facility] DEPT Provider Note   CSN: [id number] Arrival date & time: 10/28/20  7711     History Chief Complaint  Patient presents with  . Shortness of Breath    Katelyn Villegas is a 59 y.o. female.  The history is provided by the patient. No language interpreter was used.  Shortness of Breath Severity:  Moderate Onset quality:  Gradual Duration:  1 day Timing:  Constant Progression:  Worsening Chronicity:  New Context: URI   Relieved by:  Nothing Worsened by:  Nothing Ineffective treatments:  None tried Associated symptoms: cough        Past Medical History:  Diagnosis Date  . Colitis   . COPD (chronic obstructive pulmonary disease) [facility])     Patient Active Problem List   Diagnosis Date Noted  . Acute encephalopathy 10/09/2020  . COPD (chronic obstructive pulmonary disease) ([place]) 10/09/2020  . Ulcerative colitis ([place]) 10/09/2020  . Acute respiratory failure with hypercapnia ([place]) 10/09/2020  . Acute respiratory failure ([place]) 10/09/2020    Past Surgical History:  Procedure Laterality Date  . CHOLECYSTECTOMY    . COLON SURGERY       OB History   No obstetric history on file.     Family History  Family history unknown: Yes    Social History   Tobacco Use  . Smoking status: Never Smoker  . Smokeless tobacco: Never Used  Substance Use Topics  . Alcohol use: Not on file  . Drug use: Never    Home Medications Prior to Admission medications   Medication Sig Start Date End Date Taking? Authorizing Provider  albuterol (VENTOLIN HFA) 108 (90 Base) MCG/ACT inhaler Inhale 1-2 puffs into the lungs every 6 (six) hours as needed for wheezing or shortness of breath.   Yes [provider]  buprenorphine-naloxone (SUBOXONE) 8-2 mg SUBL SL tablet Place 1 tablet under the tongue 2 (two) times daily. 10/12/20  Yes Danford, Suann Larry, MD  dexlansoprazole (DEXILANT) 60 MG capsule Take 1 capsule (60 mg total) by mouth  daily. 10/12/20 01/10/21 Yes Dahal, Marlowe Aschoff, MD  diazepam (VALIUM) 10 MG tablet Take 10 mg by mouth 2 (two) times daily as needed for anxiety.   Yes [provider]  ipratropium-albuterol (DUONEB) 0.5-2.5 (3) MG/3ML SOLN Take 3 mLs by nebulization 4 (four) times daily as needed (shortness of breath).  09/21/20  Yes [provider]  ondansetron (ZOFRAN) 4 MG tablet Take 1 tablet (4 mg total) by mouth every 8 (eight) hours as needed for nausea or vomiting. 10/12/20  Yes Dahal, Marlowe Aschoff, MD    Allergies    Morphine, Procaine, Aspirin, Sulfa antibiotics, Sulfamethoxazole, Meperidine, and Penicillins  Review of Systems   Review of Systems  Respiratory: Positive for cough and shortness of breath.   All other systems reviewed and are negative.   Physical Exam Updated Vital Signs BP 103/60 (BP Location: Left Arm)   Pulse 77   Temp 98.5 F (36.9 C) (Oral)   Resp 17   Ht 4' 11"  (1.499 m)   Wt 44.5 kg   SpO2 100%   BMI 19.79 kg/m   Physical Exam Vitals reviewed.  HENT:     Head: Normocephalic.  Eyes:     Pupils: Pupils are equal, round, and reactive to light.  Cardiovascular:     Rate and Rhythm: Normal rate.  Pulmonary:     Effort: Pulmonary effort is normal.     Breath sounds: Normal breath sounds. No decreased breath sounds.  Chest:  Chest wall: No deformity.  Abdominal:     Palpations: Abdomen is soft.  Musculoskeletal:        General: Normal range of motion.     Cervical back: Normal range of motion.  Skin:    General: Skin is warm.  Neurological:     General: No focal deficit present.     Mental Status: She is alert.  Psychiatric:        Mood and Affect: Mood normal.     ED Results / Procedures / Treatments   Labs (all labs ordered are listed, but only abnormal results are displayed) Labs Reviewed  CBC WITH DIFFERENTIAL/PLATELET - Abnormal; Notable for the following components:      Result Value   RDW 15.6 (*)    Platelets 129 (*)    Lymphs  Abs 0.5 (*)    All other components within normal limits  COMPREHENSIVE METABOLIC PANEL - Abnormal; Notable for the following components:   Glucose, Bld 62 (*)    Calcium 8.0 (*)    Total Protein 5.5 (*)    Albumin 3.4 (*)    All other components within normal limits  RESP PANEL BY RT-PCR (FLU A&B, COVID) [id number]    EKG None  Radiology DG Chest Port 1 View  Result Date: 10/28/2020 CLINICAL DATA:  Shortness of breath EXAM: PORTABLE CHEST 1 VIEW COMPARISON:  September 10, 2020 FINDINGS: No pneumothorax. The cardiomediastinal silhouette is stable. No pulmonary nodules or masses. No focal infiltrates. No overt edema. IMPRESSION: No active disease. Electronically Signed   By: Dorise Bullion III M.D   On: 10/28/2020 11:50    Procedures Procedures (including critical care time)  Medications Ordered in ED Medications  oxyCODONE-acetaminophen (PERCOCET/ROXICET) 5-325 MG per tablet 2 tablet (2 tablets Oral Given 10/28/20 1159)    ED Course  I have reviewed the triage vital signs and the nursing notes.  Pertinent labs & imaging results that were available during my care of the patient were reviewed by me and considered in my medical decision making (see chart for details).    MDM Rules/Calculators/A&P                         Chest xray normal. Labs normal  covid is negative, influenza negative  Pt able to ambulate without oxygen without difficulty.  02 sats 98%  RN concerned about home setting.  Social work spoke with pt.  Final Clinical Impression(s) / ED Diagnoses Final diagnoses:  Cough    Rx / DC Orders ED Discharge Orders    None    An After Visit Summary was printed and given to the patient.    Fransico Meadow, [place] 10/28/20 1634    Lacretia Leigh, MD 10/29/20 1332

## 2020-10-28 NOTE — ED Triage Notes (Signed)
Pt was seen by EMS earlier today and refused transfer. Pt called 911 again to be transferred to ED because pt states that she was so shob that she was unable to walk. Pt was 89% on RA and RR 28 upon EMS arrival; EMS noted NAD.

## 2020-10-28 NOTE — Progress Notes (Signed)
TOC CM received call for assistance with transportation to [place]. Pt roommate is coming after dinner. Per [facility] it will be $54 and max allowance is $18. Pt would have to pay the difference. Anything greater has to be approved through [facility]. [place], [place] ED TOC CM [phone]
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
## 2020-10-28 NOTE — Discharge Instructions (Addendum)
See your Physician for reheck.

## 2020-10-28 NOTE — Progress Notes (Signed)
..   Transition of Care [facility]) - Emergency Department Mini Assessment   Patient Details  Name: Katelyn Villegas MRN: [id number] Date of Birth: [birth year]  Transition [facility] Care [facility]) CM/SW Contact:    Erenest Rasher, RN Phone Number: [phone] 10/28/2020, 3:41 PM   Clinical Narrative:  TOC CM spoke to pt at bedside. States she is staying with an acquiantance. She will be able to go back or states she will get a hotel room. She is on the top of list for housing to receive voucher to get her own apt in [place]. She does have suboxone meds with her and understands she may need to lock up meds to prevent them from being stolen.   ED Mini Assessment: What brought you to the Emergency Department? : shortness of breath  Barriers to Discharge: No Barriers Identified     Means of departure: Car  Interventions which prevented an admission or readmission: Medication Review, Homeless Screening    Patient Contact and Communications        ,                 Admission diagnosis:  SOB Patient Active Problem List   Diagnosis Date Noted  . Acute encephalopathy 10/09/2020  . COPD (chronic obstructive pulmonary disease) ([place]) 10/09/2020  . Ulcerative colitis ([place]) 10/09/2020  . Acute respiratory failure with hypercapnia ([place]) 10/09/2020  . Acute respiratory failure ([place]) 10/09/2020   PCP:  Guadlupe Spanish, MD Pharmacy:   [facility] [address], [place] - [place] [address] [place] [address] Phone: [phone] Fax: [phone]  [facility] [place], [place] - [address] [place] [address] Phone: [phone] Fax: [phone]

## 2020-11-04 ENCOUNTER — Emergency Department (HOSPITAL_COMMUNITY): Payer: Medicaid Other

## 2020-11-04 ENCOUNTER — Inpatient Hospital Stay (HOSPITAL_COMMUNITY)
Admission: EM | Admit: 2020-11-04 | Discharge: 2020-11-07 | DRG: 917 | Disposition: A | Discharge status: Home / Self Care | Payer: Medicaid Other | Attending: Internal Medicine | Admitting: Internal Medicine

## 2020-11-04 ENCOUNTER — Encounter (HOSPITAL_COMMUNITY): Payer: Self-pay | Admitting: Emergency Medicine

## 2020-11-04 ENCOUNTER — Other Ambulatory Visit: Payer: Self-pay

## 2020-11-04 DIAGNOSIS — Z888 Allergy status to other drugs, medicaments and biological substances status: Secondary | ICD-10-CM

## 2020-11-04 DIAGNOSIS — T40491A Poisoning by other synthetic narcotics, accidental (unintentional), initial encounter: Principal | ICD-10-CM | POA: Diagnosis present

## 2020-11-04 DIAGNOSIS — K5190 Ulcerative colitis, unspecified, without complications: Secondary | ICD-10-CM | POA: Diagnosis present

## 2020-11-04 DIAGNOSIS — J9621 Acute and chronic respiratory failure with hypoxia: Secondary | ICD-10-CM | POA: Diagnosis present

## 2020-11-04 DIAGNOSIS — Z882 Allergy status to sulfonamides status: Secondary | ICD-10-CM

## 2020-11-04 DIAGNOSIS — I4510 Unspecified right bundle-branch block: Secondary | ICD-10-CM | POA: Diagnosis present

## 2020-11-04 DIAGNOSIS — T424X1A Poisoning by benzodiazepines, accidental (unintentional), initial encounter: Secondary | ICD-10-CM | POA: Diagnosis present

## 2020-11-04 DIAGNOSIS — J9601 Acute respiratory failure with hypoxia: Secondary | ICD-10-CM | POA: Diagnosis not present

## 2020-11-04 DIAGNOSIS — J439 Emphysema, unspecified: Secondary | ICD-10-CM | POA: Diagnosis present

## 2020-11-04 DIAGNOSIS — Z880 Allergy status to penicillin: Secondary | ICD-10-CM

## 2020-11-04 DIAGNOSIS — J449 Chronic obstructive pulmonary disease, unspecified: Secondary | ICD-10-CM

## 2020-11-04 DIAGNOSIS — G928 Other toxic encephalopathy: Secondary | ICD-10-CM | POA: Diagnosis present

## 2020-11-04 DIAGNOSIS — G9341 Metabolic encephalopathy: Secondary | ICD-10-CM | POA: Diagnosis not present

## 2020-11-04 DIAGNOSIS — G894 Chronic pain syndrome: Secondary | ICD-10-CM | POA: Diagnosis present

## 2020-11-04 DIAGNOSIS — J9602 Acute respiratory failure with hypercapnia: Secondary | ICD-10-CM | POA: Diagnosis not present

## 2020-11-04 DIAGNOSIS — Y92009 Unspecified place in unspecified non-institutional (private) residence as the place of occurrence of the external cause: Secondary | ICD-10-CM

## 2020-11-04 DIAGNOSIS — Z885 Allergy status to narcotic agent status: Secondary | ICD-10-CM

## 2020-11-04 DIAGNOSIS — Z9049 Acquired absence of other specified parts of digestive tract: Secondary | ICD-10-CM

## 2020-11-04 DIAGNOSIS — Z933 Colostomy status: Secondary | ICD-10-CM

## 2020-11-04 DIAGNOSIS — M25431 Effusion, right wrist: Secondary | ICD-10-CM

## 2020-11-04 DIAGNOSIS — F419 Anxiety disorder, unspecified: Secondary | ICD-10-CM | POA: Diagnosis present

## 2020-11-04 DIAGNOSIS — F1120 Opioid dependence, uncomplicated: Secondary | ICD-10-CM | POA: Diagnosis present

## 2020-11-04 DIAGNOSIS — R4182 Altered mental status, unspecified: Secondary | ICD-10-CM

## 2020-11-04 DIAGNOSIS — Z79899 Other long term (current) drug therapy: Secondary | ICD-10-CM

## 2020-11-04 DIAGNOSIS — Z20822 Contact with and (suspected) exposure to covid-19: Secondary | ICD-10-CM | POA: Diagnosis present

## 2020-11-04 DIAGNOSIS — J9622 Acute and chronic respiratory failure with hypercapnia: Secondary | ICD-10-CM | POA: Diagnosis present

## 2020-11-04 LAB — URINALYSIS, ROUTINE W REFLEX MICROSCOPIC
Bacteria, UA: NONE SEEN
Bilirubin Urine: NEGATIVE
Glucose, UA: NEGATIVE mg/dL
Hgb urine dipstick: NEGATIVE
Ketones, ur: NEGATIVE mg/dL
Leukocytes,Ua: NEGATIVE
Nitrite: NEGATIVE
Protein, ur: 30 mg/dL — AB
Specific Gravity, Urine: 1.018 (ref 1.005–1.030)
pH: 5 (ref 5.0–8.0)

## 2020-11-04 LAB — BASIC METABOLIC PANEL
Anion gap: 10 (ref 5–15)
BUN: 12 mg/dL (ref 8–23)
CO2: 28 mmol/L (ref 22–32)
Calcium: 9.4 mg/dL (ref 8.9–10.3)
Chloride: 101 mmol/L (ref 98–111)
Creatinine, Ser: 0.58 mg/dL (ref 0.44–1.00)
GFR, Estimated: >60 mL/min (ref >60)
Glucose, Bld: 118 mg/dL — ABNORMAL HIGH (ref 70–99)
Potassium: 4.5 mmol/L (ref 3.5–5.1)
Sodium: 139 mmol/L (ref 135–145)

## 2020-11-04 LAB — I-STAT ARTERIAL BLOOD GAS, ED
Acid-Base Excess: 6 mmol/L — ABNORMAL HIGH (ref 0.0–2.0)
Bicarbonate: 34.2 mmol/L — ABNORMAL HIGH (ref 20.0–28.0)
Calcium, Ion: 1.28 mmol/L (ref 1.15–1.40)
HCT: 39 % (ref 36.0–46.0)
Hemoglobin: 13.3 g/dL (ref 12.0–15.0)
O2 Saturation: 99 %
Potassium: 4.2 mmol/L (ref 3.5–5.1)
Sodium: 140 mmol/L (ref 135–145)
TCO2: 36 mmol/L — ABNORMAL HIGH (ref 22–32)
pCO2 arterial: 66.1 mmHg (ref 32.0–48.0)
pH, Arterial: 7.322 — ABNORMAL LOW (ref 7.350–7.450)
pO2, Arterial: 134 mmHg — ABNORMAL HIGH (ref 83.0–108.0)

## 2020-11-04 LAB — CBC
HCT: 44.2 % (ref 36.0–46.0)
Hemoglobin: 13.2 g/dL (ref 12.0–15.0)
MCH: 28.3 pg (ref 26.0–34.0)
MCHC: 29.9 g/dL — ABNORMAL LOW (ref 30.0–36.0)
MCV: 94.6 fL (ref 80.0–100.0)
Platelets: 132 10*3/uL — ABNORMAL LOW (ref 150–400)
RBC: 4.67 MIL/uL (ref 3.87–5.11)
RDW: 15.9 % — ABNORMAL HIGH (ref 11.5–15.5)
WBC: 4.4 10*3/uL (ref 4.0–10.5)
nRBC: 0 % (ref 0.0–0.2)

## 2020-11-04 LAB — RAPID URINE DRUG SCREEN, HOSP PERFORMED
Amphetamines: NOT DETECTED
Barbiturates: NOT DETECTED
Benzodiazepines: POSITIVE — AB
Cocaine: NOT DETECTED
Opiates: NOT DETECTED
Tetrahydrocannabinol: NOT DETECTED

## 2020-11-04 LAB — HEPATIC FUNCTION PANEL
ALT: 12 U/L (ref 0–44)
AST: 17 U/L (ref 15–41)
Albumin: 3.7 g/dL (ref 3.5–5.0)
Alkaline Phosphatase: 93 U/L (ref 38–126)
Bilirubin, Direct: <0.1 mg/dL (ref 0.0–0.2)
Total Bilirubin: 0.5 mg/dL (ref 0.3–1.2)
Total Protein: 6.4 g/dL — ABNORMAL LOW (ref 6.5–8.1)

## 2020-11-04 LAB — TROPONIN I (HIGH SENSITIVITY)
Troponin I (High Sensitivity): 7 ng/L (ref <18)
Troponin I (High Sensitivity): 6 ng/L (ref <18)

## 2020-11-04 LAB — I-STAT VENOUS BLOOD GAS, ED
Acid-Base Excess: 2 mmol/L (ref 0.0–2.0)
Bicarbonate: 31.7 mmol/L — ABNORMAL HIGH (ref 20.0–28.0)
Calcium, Ion: 1.22 mmol/L (ref 1.15–1.40)
HCT: 45 % (ref 36.0–46.0)
Hemoglobin: 15.3 g/dL — ABNORMAL HIGH (ref 12.0–15.0)
O2 Saturation: 81 %
Potassium: 4.6 mmol/L (ref 3.5–5.1)
Sodium: 139 mmol/L (ref 135–145)
TCO2: 34 mmol/L — ABNORMAL HIGH (ref 22–32)
pCO2, Ven: 73.6 mmHg (ref 44.0–60.0)
pH, Ven: 7.242 — ABNORMAL LOW (ref 7.250–7.430)
pO2, Ven: 56 mmHg — ABNORMAL HIGH (ref 32.0–45.0)

## 2020-11-04 LAB — PROTIME-INR
INR: 1 (ref 0.8–1.2)
Prothrombin Time: 12.4 seconds (ref 11.4–15.2)

## 2020-11-04 LAB — RESP PANEL BY RT-PCR (FLU A&B, COVID) ARPGX2
Influenza A by PCR: NEGATIVE
Influenza B by PCR: NEGATIVE
SARS Coronavirus 2 by RT PCR: NEGATIVE

## 2020-11-04 LAB — CBG MONITORING, ED
Glucose-Capillary: 108 mg/dL — ABNORMAL HIGH (ref 70–99)
Glucose-Capillary: 104 mg/dL — ABNORMAL HIGH (ref 70–99)

## 2020-11-04 LAB — SALICYLATE LEVEL: Salicylate Lvl: <7 mg/dL — ABNORMAL LOW (ref 7.0–30.0)

## 2020-11-04 LAB — ACETAMINOPHEN LEVEL: Acetaminophen (Tylenol), Serum: <10 ug/mL — ABNORMAL LOW (ref 10–30)

## 2020-11-04 LAB — AMMONIA: Ammonia: 11 umol/L (ref 9–35)

## 2020-11-04 MED ORDER — ACETAMINOPHEN 650 MG RE SUPP: 650.0000 mg | Freq: Four times a day (QID) | RECTAL | Status: DC | PRN

## 2020-11-04 MED ORDER — POLYETHYLENE GLYCOL 3350 17 G PO PACK: 17.0000 g | PACK | Freq: Every day | ORAL | Status: DC | PRN

## 2020-11-04 MED ORDER — PANTOPRAZOLE SODIUM 40 MG PO TBEC
40.0000 mg | DELAYED_RELEASE_TABLET | Freq: Every day | ORAL | Status: DC
  Administered 2020-11-05 – 2020-11-07 (×3): 40 mg via ORAL
  Filled 2020-11-04 (×3): qty 1

## 2020-11-04 MED ORDER — ACETAMINOPHEN 325 MG PO TABS: 650.0000 mg | ORAL_TABLET | Freq: Four times a day (QID) | ORAL | Status: DC | PRN

## 2020-11-04 MED ORDER — ENOXAPARIN SODIUM 40 MG/0.4ML ~~LOC~~ SOLN
40.0000 mg | SUBCUTANEOUS | Status: DC
  Administered 2020-11-05: 21:00:00 40 mg via SUBCUTANEOUS
  Filled 2020-11-04 (×3): qty 0.4

## 2020-11-04 MED ORDER — ALBUTEROL SULFATE HFA 108 (90 BASE) MCG/ACT IN AERS: 1.0000 | INHALATION_SPRAY | Freq: Four times a day (QID) | RESPIRATORY_TRACT | Status: DC | PRN

## 2020-11-04 MED ORDER — ACETAMINOPHEN 500 MG PO TABS
500.0000 mg | ORAL_TABLET | Freq: Once | ORAL | Status: AC
  Administered 2020-11-04: 12:00:00 500 mg via ORAL
  Filled 2020-11-04: qty 1

## 2020-11-04 MED ORDER — ONDANSETRON HCL 4 MG/2ML IJ SOLN: 4.0000 mg | Freq: Four times a day (QID) | INTRAMUSCULAR | Status: DC | PRN

## 2020-11-04 MED ORDER — IPRATROPIUM-ALBUTEROL 0.5-2.5 (3) MG/3ML IN SOLN: 3.0000 mL | Freq: Four times a day (QID) | RESPIRATORY_TRACT | Status: DC | PRN

## 2020-11-04 MED ORDER — NALOXONE HCL 0.4 MG/ML IJ SOLN
0.4000 mg | Freq: Once | INTRAMUSCULAR | Status: AC
  Administered 2020-11-04: 14:00:00 0.4 mg via INTRAVENOUS
  Filled 2020-11-04: qty 1

## 2020-11-04 MED ORDER — ONDANSETRON HCL 4 MG PO TABS
4.0000 mg | ORAL_TABLET | Freq: Four times a day (QID) | ORAL | Status: DC | PRN
  Administered 2020-11-05 – 2020-11-06 (×2): 4 mg via ORAL
  Filled 2020-11-04 (×2): qty 1

## 2020-11-04 MED ORDER — ALBUTEROL SULFATE (2.5 MG/3ML) 0.083% IN NEBU: 2.5000 mg | INHALATION_SOLUTION | RESPIRATORY_TRACT | Status: DC | PRN

## 2020-11-04 NOTE — ED Notes (Signed)
Pt removed off of bipap by RT at this time. Tolerating well; speraking full sentences.

## 2020-11-04 NOTE — ED Notes (Signed)
Pt refused lovenox; pt also requesting medication (valium, etc) and educated pt on reason why they are on hold at this time. Pt is currently on bipap and drowsy. Pt did not verbalize that she understood this RN.

## 2020-11-04 NOTE — Consult Note (Addendum)
NAME:  Katelyn Villegas, MRN:  [id number], DOB:  1960-12-30, LOS: 0 ADMISSION DATE:  11/04/2020, CONSULTATION DATE: 11/04/2020 REFERRING MD:  Horris Latino - TRH, CHIEF COMPLAINT: Hypercarbic respiratory failure  HPI/course in hospital  59 year old woman chronic pain who presents with somnolence.  Endorses that she may have taken too much Valium and Suboxone to self medicate pain.  Similar admission with hypercarbic respiratory failure back last month  Past Medical History   Past Medical History:  Diagnosis Date  . Colitis   . COPD (chronic obstructive pulmonary disease) ([place])      Past Surgical History:  Procedure Laterality Date  . CHOLECYSTECTOMY    . COLON SURGERY       Review of Systems:   Review of Systems  Unable to perform ROS: Mental acuity    Social History   reports that she has never smoked. She has never used smokeless tobacco. She reports that she does not use drugs.   Family History   Her Family history is unknown by patient.   Allergies Allergies  Allergen Reactions  . Morphine     Unknown reaction   . Procaine Other (See Comments)    BREATHING ISSUE   . Aspirin     ULCERS   . Sulfa Antibiotics Itching  . Sulfamethoxazole Other (See Comments)    Unknown reaction  . Meperidine Rash  . Penicillins Itching    Tolerated cephalosporins      Home Medications  Prior to Admission medications   Medication Sig Start Date End Date Taking? Authorizing Provider  albuterol (VENTOLIN HFA) 108 (90 Base) MCG/ACT inhaler Inhale 1-2 puffs into the lungs every 6 (six) hours as needed for wheezing or shortness of breath.    [provider]  buprenorphine-naloxone (SUBOXONE) 8-2 mg SUBL SL tablet Place 1 tablet under the tongue 2 (two) times daily. 10/12/20   Danford, Suann Larry, MD  dexlansoprazole (DEXILANT) 60 MG capsule Take 1 capsule (60 mg total) by mouth daily. 10/12/20 01/10/21  Terrilee Croak, MD  diazepam (VALIUM) 10 MG tablet Take 10 mg by mouth 2  (two) times daily as needed for anxiety.    [provider]  ipratropium-albuterol (DUONEB) 0.5-2.5 (3) MG/3ML SOLN Take 3 mLs by nebulization 4 (four) times daily as needed (shortness of breath).  09/21/20   [provider]  ondansetron (ZOFRAN) 4 MG tablet Take 1 tablet (4 mg total) by mouth every 8 (eight) hours as needed for nausea or vomiting. 10/12/20   Terrilee Croak, MD     Interim history/subjective:  Mentation is marginally better according to bedside nurse  Objective   Blood pressure 94/60, pulse 77, temperature (!) 97.5 F (36.4 C), temperature source Oral, resp. rate 18, SpO2 99 %.       No intake or output data in the 24 hours ending 11/04/20 1851 There were no vitals filed for this visit.  Examination: Physical Exam Constitutional:      Appearance: She is underweight. She is ill-appearing.  Eyes:     Conjunctiva/sclera: Conjunctivae normal.  Neck:     Trachea: Trachea and phonation normal.  Cardiovascular:     Rate and Rhythm: Normal rate and regular rhythm.     Heart sounds: Normal heart sounds.  Pulmonary:     Effort: No prolonged expiration or respiratory distress.     Breath sounds: Normal breath sounds and air entry.  Abdominal:     General: Abdomen is flat.     Palpations: Abdomen is soft.  Skin:  General: Skin is warm.     Capillary Refill: Capillary refill takes 2 to 3 seconds.  Neurological:     Mental Status: She is lethargic.     GCS: GCS eye subscore is 4. GCS verbal subscore is 4. GCS motor subscore is 5.     Motor: Motor function is intact.     Comments: Will arouse and answer questions transiently.  Still stuporous.  Poor attention.      Ancillary tests (personally reviewed)  CBC: Recent Labs  Lab 11/04/20 1045 11/04/20 1419  WBC 4.4  --   HGB 13.2 15.3*  HCT 44.2 45.0  MCV 94.6  --   PLT 132*  --     Basic Metabolic Panel: Recent Labs  Lab 11/04/20 1045 11/04/20 1419  NA 139 139  K 4.5 4.6  CL 101  --    CO2 28  --   GLUCOSE 118*  --   BUN 12  --   CREATININE 0.58  --   CALCIUM 9.4  --    GFR: Estimated Creatinine Clearance: 50.4 mL/min (by C-G formula based on SCr of 0.58 mg/dL). Recent Labs  Lab 11/04/20 1045  WBC 4.4    Liver Function Tests: Recent Labs  Lab 11/04/20 1139  AST 17  ALT 12  ALKPHOS 93  BILITOT 0.5  PROT 6.4*  ALBUMIN 3.7   No results for input(s): LIPASE, AMYLASE in the last 168 hours. Recent Labs  Lab 11/04/20 1139  AMMONIA 11    ABG    Component Value Date/Time   PHART 7.346 (L) 10/10/2020 0540   PCO2ART 58.2 (H) 10/10/2020 0540   PO2ART 96.1 10/10/2020 0540   HCO3 31.7 (H) 11/04/2020 1419   TCO2 34 (H) 11/04/2020 1419   O2SAT 81.0 11/04/2020 1419     Coagulation Profile: Recent Labs  Lab 11/04/20 1139  INR 1.0    Cardiac Enzymes: No results for input(s): CKTOTAL, CKMB, CKMBINDEX, TROPONINI in the last 168 hours.  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 Stuporous but will arouse and follow commands.  Appears to be able to protect airway sufficiently.  Plan:  -Continue BiPAP on current setting -Repeat ABG now -Notify critical care to reassess if PCO2 is not decreasing.  May need transfer to ICU at that time and possible intubation. -Otherwise can proceed with PCU admission at this time   CRITICAL CARE Performed by: Kipp Brood   Total critical care time: 35 minutes  Critical care time was exclusive of separately billable procedures and treating other patients.  Critical care was necessary to treat or  prevent imminent or life-threatening deterioration.  Critical care was time spent personally by me on the following activities: development of treatment plan with patient and/or surrogate as well as nursing, discussions with consultants, evaluation of patient's response to treatment, examination of patient, obtaining history from patient or surrogate, ordering and performing treatments and interventions, ordering and  review of laboratory studies, ordering and review of radiographic studies, pulse oximetry, re-evaluation of patient's condition and participation in multidisciplinary rounds.  Kipp Brood, MD [facility] ICU Physician [place]  Pager: [phone] Mobile: [phone] After hours: [phone].    11/04/2020, 6:51 PM

## 2020-11-04 NOTE — Progress Notes (Signed)
Pt taken off BiPAP to give pt a break. No distress noted at this time. RR 18-20 Sp02 96% on 5 L [place].

## 2020-11-04 NOTE — ED Notes (Signed)
RT at bedside to apply BiPap

## 2020-11-04 NOTE — ED Notes (Signed)
Emptied Pt's ostomy bag. Pt remains somnolent and lethargic but arousable  by speech and communicating purposefully. IV Narcan produced no noticeable change in mental state. Pt now endorsing right "thumb numbness," Pt without any other neurological deficits. MD Dykstra notified of this development.

## 2020-11-04 NOTE — ED Triage Notes (Signed)
Pt to triage via [facility] from home.  Per EMS they were called out for stroke symptoms but states pt with no neuro deficits.  Pt reports generalized weakness and pain all over.  Difficulty getting pt to follow commands in triage.

## 2020-11-04 NOTE — ED Provider Notes (Signed)
[facility]GENC[facility] DEPARTMENT Provider Note   CSN: [id number] Arrival date & time: 11/04/20  1013     History Chief Complaint  Patient presents with  . Weakness    Katelyn Villegas is a 59 y.o. female.  Level 5 caveat history limited due to altered mental status.  Per EMS report, they were called out for stroke symptoms but they did not identify any neurologic deficits.  They reported that patient was worried about generalized weakness and pain all over.  When questioning patient, she answer some basic questions appropriate but does not elaborate in any detail regarding her current condition.  She reports that she does not feel well, has a headache.  She is able to express that she has no chest pain, abdominal pain, back pain, nausea, vomiting, fever, neck pain or neck stiffness.  She does endorse taking Suboxone and Valium for her chronic pain and anxiety.    HPI     Past Medical History:  Diagnosis Date  . Colitis   . COPD (chronic obstructive pulmonary disease) [facility])     Patient Active Problem List   Diagnosis Date Noted  . Acute encephalopathy 10/09/2020  . COPD (chronic obstructive pulmonary disease) ([place]) 10/09/2020  . Ulcerative colitis ([place]) 10/09/2020  . Acute respiratory failure with hypercapnia ([place]) 10/09/2020  . Acute respiratory failure ([place]) 10/09/2020    Past Surgical History:  Procedure Laterality Date  . CHOLECYSTECTOMY    . COLON SURGERY       OB History   No obstetric history on file.     Family History  Family history unknown: Yes    Social History   Tobacco Use  . Smoking status: Never Smoker  . Smokeless tobacco: Never Used  Substance Use Topics  . Drug use: Never    Home Medications Prior to Admission medications   Medication Sig Start Date End Date Taking? Authorizing Provider  albuterol (VENTOLIN HFA) 108 (90 Base) MCG/ACT inhaler Inhale 1-2 puffs into the lungs every 6 (six) hours as needed for  wheezing or shortness of breath.    [provider]  buprenorphine-naloxone (SUBOXONE) 8-2 mg SUBL SL tablet Place 1 tablet under the tongue 2 (two) times daily. 10/12/20   Danford, Suann Larry, MD  dexlansoprazole (DEXILANT) 60 MG capsule Take 1 capsule (60 mg total) by mouth daily. 10/12/20 01/10/21  Terrilee Croak, MD  diazepam (VALIUM) 10 MG tablet Take 10 mg by mouth 2 (two) times daily as needed for anxiety.    [provider]  ipratropium-albuterol (DUONEB) 0.5-2.5 (3) MG/3ML SOLN Take 3 mLs by nebulization 4 (four) times daily as needed (shortness of breath).  09/21/20   [provider]  ondansetron (ZOFRAN) 4 MG tablet Take 1 tablet (4 mg total) by mouth every 8 (eight) hours as needed for nausea or vomiting. 10/12/20   Terrilee Croak, MD    Allergies    Morphine, Procaine, Aspirin, Sulfa antibiotics, Sulfamethoxazole, Meperidine, and Penicillins  Review of Systems   Review of Systems  Unable to perform ROS: Mental status change    Physical Exam Updated Vital Signs BP 95/70   Pulse 74   Temp (!) 97.5 F (36.4 C) (Oral)   Resp 19   SpO2 99%   Physical Exam Vitals and nursing note reviewed.  Constitutional:      General: She is not in acute distress.    Appearance: She is well-developed and well-nourished.     Comments: Somewhat disheveled, somewhat confused, somewhat drowsy but easily  aroused  HENT:     Head: Normocephalic and atraumatic.  Eyes:     Conjunctiva/sclera: Conjunctivae normal.  Cardiovascular:     Rate and Rhythm: Normal rate and regular rhythm.     Heart sounds: No murmur heard.   Pulmonary:     Effort: Pulmonary effort is normal. No respiratory distress.     Breath sounds: Normal breath sounds.  Abdominal:     Palpations: Abdomen is soft.     Tenderness: There is no abdominal tenderness.  Musculoskeletal:        General: No deformity, signs of injury or edema.     Cervical back: Neck supple.  Skin:    General: Skin is  warm and dry.  Neurological:     Mental Status: She is alert.     Comments: Opens eyes to voice, somewhat confused verbal response, answers basic questions appropriately, follows commands, strength grossly intact in all 4 extremities  Psychiatric:        Mood and Affect: Mood and affect normal.     ED Results / Procedures / Treatments   Labs (all labs ordered are listed, but only abnormal results are displayed) Labs Reviewed  BASIC METABOLIC PANEL - Abnormal; Notable for the following components:      Result Value   Glucose, Bld 118 (*)    All other components within normal limits  CBC - Abnormal; Notable for the following components:   MCHC 29.9 (*)    RDW 15.9 (*)    Platelets 132 (*)    All other components within normal limits  URINALYSIS, ROUTINE W REFLEX MICROSCOPIC - Abnormal; Notable for the following components:   Protein, ur 30 (*)    All other components within normal limits  HEPATIC FUNCTION PANEL - Abnormal; Notable for the following components:   Total Protein 6.4 (*)    All other components within normal limits  RAPID URINE DRUG SCREEN, HOSP PERFORMED - Abnormal; Notable for the following components:   Benzodiazepines POSITIVE (*)    All other components within normal limits  ACETAMINOPHEN LEVEL - Abnormal; Notable for the following components:   Acetaminophen (Tylenol), Serum <10 (*)    All other components within normal limits  SALICYLATE LEVEL - Abnormal; Notable for the following components:   Salicylate Lvl <9.0 (*)    All other components within normal limits  CBG MONITORING, ED - Abnormal; Notable for the following components:   Glucose-Capillary 108 (*)    All other components within normal limits  CBG MONITORING, ED - Abnormal; Notable for the following components:   Glucose-Capillary 104 (*)    All other components within normal limits  I-STAT VENOUS BLOOD GAS, ED - Abnormal; Notable for the following components:   pH, Ven 7.242 (*)    pCO2, Ven  73.6 (*)    pO2, Ven 56.0 (*)    Bicarbonate 31.7 (*)    TCO2 34 (*)    Hemoglobin 15.3 (*)    All other components within normal limits  RESP PANEL BY RT-PCR (FLU A&B, COVID) ARPGX2  AMMONIA  PROTIME-INR  BLOOD GAS, VENOUS  TROPONIN I (HIGH SENSITIVITY)  TROPONIN I (HIGH SENSITIVITY)    EKG EKG Interpretation  Date/Time:  Sunday November 04 2020 11:59:12 EST Ventricular Rate:  76 PR Interval:  132 QRS Duration: 109 QT Interval:  434 QTC Calculation: 488 R Axis:   -78 Text Interpretation: Sinus rhythm Incomplete RBBB and LAFB Consider right ventricular hypertrophy Left ventricular hypertrophy Anterior Q waves, possibly due  to LVH no acute STEMI No significant change since Since last tracing of earlier today Confirmed by Madalyn Rob [id number]) on 11/04/2020 1:10:58 PM   Radiology CT Head Wo Contrast  Result Date: 11/04/2020 CLINICAL DATA:  Acute delirium EXAM: CT HEAD WITHOUT CONTRAST TECHNIQUE: Contiguous axial images were obtained from the base of the skull through the vertex without intravenous contrast. COMPARISON:  10/08/2020, 07/13/2019 FINDINGS: Brain: No evidence of acute infarction, hemorrhage, hydrocephalus, extra-axial collection or mass lesion/mass effect. Vascular: No hyperdense vessel or unexpected calcification. Skull: Normal. Negative for fracture or focal lesion. Sinuses/Orbits: No acute finding. Other: None. IMPRESSION: No acute intracranial abnormality.  Stable head CT without contrast. Electronically Signed   By: Jerilynn Mages.  Shick M.D.   On: 11/04/2020 12:11   DG Chest Portable 1 View  Result Date: 11/04/2020 CLINICAL DATA:  59 year old female with history of confusion. EXAM: PORTABLE CHEST 1 VIEW COMPARISON:  Chest x-ray 10/28/2020. FINDINGS: Hyperexpansion of the lungs with emphysematous changes, similar to prior studies. Skin fold artifact in the right hemithorax. Lung volumes are normal. No consolidative airspace disease. No pleural effusions. No pneumothorax. No  pulmonary nodule or mass noted. Pulmonary vasculature and the cardiomediastinal silhouette are within normal limits. IMPRESSION: 1. No radiographic evidence of acute cardiopulmonary disease. 2. Emphysema. Electronically Signed   By: Vinnie Langton M.D.   On: 11/04/2020 14:38    Procedures .Critical Care Performed by: Lucrezia Starch, MD Authorized by: Lucrezia Starch, MD   Critical care provider statement:    Critical care time (minutes):  35   Critical care was time spent personally by me on the following activities:  Discussions with consultants, evaluation of patient's response to treatment, examination of patient, ordering and performing treatments and interventions, ordering and review of laboratory studies, ordering and review of radiographic studies, pulse oximetry, re-evaluation of patient's condition, obtaining history from patient or surrogate and review of old charts   (including critical care time)  Medications Ordered in ED Medications  acetaminophen (TYLENOL) tablet 500 mg (500 mg Oral Given 11/04/20 1210)  naloxone (NARCAN) injection 0.4 mg (0.4 mg Intravenous Given 11/04/20 1400)    ED Course  I have reviewed the triage vital signs and the nursing notes.  Pertinent labs & imaging results that were available during my care of the patient were reviewed by me and considered in my medical decision making (see chart for details).  Clinical Course as of 11/04/20 1639  Sun Nov 04, 2020  1420 [phone] listed as emergency contact, attempted x 2, phone number not in service [RD]  1632 Tolerating bipap, query mild improvement in mental status [RD]    Clinical Course User Index [RD] Lucrezia Starch, MD   MDM Rules/Calculators/A&P                         59 year old lady presents to ER with concern for altered mental status.  On exam, patient appeared somnolent and lethargic but was easily aroused to loud verbal stimuli or painful stimuli.  Oriented x3.   Protecting airway.  No respiratory distress, normal respiratory rate, noted to have mild hypoxia on room air.  Fine on 2 L.  Broad work-up was grossly negative.  Patient reports taking benzodiazepines and Suboxone.  Tried some Narcan in case she had additional opiates on board with no improvement.  Concern for benzodiazepines may be contributing to presentation.  VBG was notable for mild respiratory acidosis.  Suspect hypercarbia may be contributing to this state.  To help with this, started patient on BiPAP therapy.  Tolerating well.  Will admit to hospitalist service for further management.  Final Clinical Impression(s) / ED Diagnoses Final diagnoses:  Acute respiratory failure with hypoxia and hypercapnia (HCC)  Altered mental status, unspecified altered mental status type    Rx / DC Orders ED Discharge Orders    None       Lucrezia Starch, MD 11/04/20 [phone]

## 2020-11-04 NOTE — H&P (Signed)
History and Physical  [name] GMW:[id number] DOB: 06-Apr-1961 DOA: 11/04/2020  PCP: Guadlupe Spanish, MD  Patient coming from: Home & is able to ambulate  Chief Complaint: Generalized weakness, AMS  HPI: Katelyn Villegas is a 59 y.o. female with medical history significant for COPD, ulcerative colitis with colostomy presented to the ER for generalized weakness, altered mental status.  She was brought in by ambulance initially for possible code stroke, but moving all extremities spontaneously.  History limited by AMS and being on BiPAP.  Per chart review, patient reported pain all over and generalized weakness.  In the ED, patient was able to answer basic questions, was able to express she had no chest pain, abdominal pain, nausea/vomiting, fever/chills.  Patient was able to lift her extremities, and follow simple commands.  Patient takes Suboxone, Valium for her chronic pain and anxiety.  Patient was recently discharged about a month ago for about similar issues.   ED Course: Noted to be dyspneic, lethargic, patient was saturating above 90 on room air, but was placed on O2 due to dyspnea, other vital signs stable.  Basic lab work unremarkable, ABG showed pH of 7.24, PCO2 of 73.6, PO2 of 56.  Chest x-ray showed emphysematous changes, CT head unremarkable.  UDS positive for benzos, takes Valium at home.  UA negative.  Patient was given 1 dose of Narcan, placed on BiPAP.  [facility] hospitalist called for admission.   Review of Systems: Review of systems are otherwise negative   Past Medical History:  Diagnosis Date  . Colitis   . COPD (chronic obstructive pulmonary disease) ([place])    Past Surgical History:  Procedure Laterality Date  . CHOLECYSTECTOMY    . COLON SURGERY      Social History:  reports that she has never smoked. She has never used smokeless tobacco. She reports that she does not use drugs. No history on file for alcohol use.   Allergies  Allergen Reactions  . Morphine      Unknown reaction   . Procaine Other (See Comments)    BREATHING ISSUE   . Aspirin     ULCERS   . Sulfa Antibiotics Itching  . Sulfamethoxazole Other (See Comments)    Unknown reaction  . Meperidine Rash  . Penicillins Itching    Tolerated cephalosporins     Family History  Family history unknown: Yes     Prior to Admission medications   Medication Sig Start Date End Date Taking? Authorizing Provider  albuterol (VENTOLIN HFA) 108 (90 Base) MCG/ACT inhaler Inhale 1-2 puffs into the lungs every 6 (six) hours as needed for wheezing or shortness of breath.    [provider]  buprenorphine-naloxone (SUBOXONE) 8-2 mg SUBL SL tablet Place 1 tablet under the tongue 2 (two) times daily. 10/12/20   Danford, Suann Larry, MD  dexlansoprazole (DEXILANT) 60 MG capsule Take 1 capsule (60 mg total) by mouth daily. 10/12/20 01/10/21  Terrilee Croak, MD  diazepam (VALIUM) 10 MG tablet Take 10 mg by mouth 2 (two) times daily as needed for anxiety.    [provider]  ipratropium-albuterol (DUONEB) 0.5-2.5 (3) MG/3ML SOLN Take 3 mLs by nebulization 4 (four) times daily as needed (shortness of breath).  09/21/20   [provider]  ondansetron (ZOFRAN) 4 MG tablet Take 1 tablet (4 mg total) by mouth every 8 (eight) hours as needed for nausea or vomiting. 10/12/20   Terrilee Croak, MD    Physical Exam: BP 113/72   Pulse 76  Temp (!) 97.5 F (36.4 C) (Oral)   Resp 15   SpO2 99%   . General: Currently on BiPAP, lethargic, easily arousable . Eyes: Normal . ENT: Normal . Neck: Supple . Cardiovascular: S1, S2 present . Respiratory: Diminished breath sounds bilaterally . Abdomen: Soft, nontender, nondistended, BS present, ostomy bag noted to be leaking greenish liquid stool . Skin: Normal . Musculoskeletal: No bilateral pedal edema noted, first and second toe of right foot in dressing, C/D/I . Psychiatric: Unable to assess . Neurologic: Able to move extremities  spontaneously, follows simple commands          Labs on Admission:  Basic Metabolic Panel: Recent Labs  Lab 11/04/20 1045 11/04/20 1419  NA 139 139  K 4.5 4.6  CL 101  --   CO2 28  --   GLUCOSE 118*  --   BUN 12  --   CREATININE 0.58  --   CALCIUM 9.4  --    Liver Function Tests: Recent Labs  Lab 11/04/20 1139  AST 17  ALT 12  ALKPHOS 93  BILITOT 0.5  PROT 6.4*  ALBUMIN 3.7   No results for input(s): LIPASE, AMYLASE in the last 168 hours. Recent Labs  Lab 11/04/20 1139  AMMONIA 11   CBC: Recent Labs  Lab 11/04/20 1045 11/04/20 1419  WBC 4.4  --   HGB 13.2 15.3*  HCT 44.2 45.0  MCV 94.6  --   PLT 132*  --    Cardiac Enzymes: No results for input(s): CKTOTAL, CKMB, CKMBINDEX, TROPONINI in the last 168 hours.  BNP (last 3 results) No results for input(s): BNP in the last 8760 hours.  ProBNP (last 3 results) No results for input(s): PROBNP in the last 8760 hours.  CBG: Recent Labs  Lab 11/04/20 1038 11/04/20 1224  GLUCAP 108* 104*    Radiological Exams on Admission: CT Head Wo Contrast  Result Date: 11/04/2020 CLINICAL DATA:  Acute delirium EXAM: CT HEAD WITHOUT CONTRAST TECHNIQUE: Contiguous axial images were obtained from the base of the skull through the vertex without intravenous contrast. COMPARISON:  10/08/2020, 07/13/2019 FINDINGS: Brain: No evidence of acute infarction, hemorrhage, hydrocephalus, extra-axial collection or mass lesion/mass effect. Vascular: No hyperdense vessel or unexpected calcification. Skull: Normal. Negative for fracture or focal lesion. Sinuses/Orbits: No acute finding. Other: None. IMPRESSION: No acute intracranial abnormality.  Stable head CT without contrast. Electronically Signed   By: Jerilynn Mages.  Shick M.D.   On: 11/04/2020 12:11   DG Chest Portable 1 View  Result Date: 11/04/2020 CLINICAL DATA:  59 year old female with history of confusion. EXAM: PORTABLE CHEST 1 VIEW COMPARISON:  Chest x-ray 10/28/2020. FINDINGS:  Hyperexpansion of the lungs with emphysematous changes, similar to prior studies. Skin fold artifact in the right hemithorax. Lung volumes are normal. No consolidative airspace disease. No pleural effusions. No pneumothorax. No pulmonary nodule or mass noted. Pulmonary vasculature and the cardiomediastinal silhouette are within normal limits. IMPRESSION: 1. No radiographic evidence of acute cardiopulmonary disease. 2. Emphysema. Electronically Signed   By: Vinnie Langton M.D.   On: 11/04/2020 14:38    EKG: Independently reviewed.  No acute ST changes  Assessment/Plan Present on Admission: . Acute metabolic encephalopathy . Acute respiratory failure with hypercapnia ([place]) . Ulcerative colitis ([place]) . COPD (chronic obstructive pulmonary disease) (HCC)  Principal Problem:   Acute metabolic encephalopathy Active Problems:   COPD (chronic obstructive pulmonary disease) (HCC)   Ulcerative colitis ([place])   Acute respiratory failure with hypercapnia (HCC)   Acute metabolic encephalopathy  Likely 2/2 medication (takes Suboxone, Valium at home) Currently afebrile, with no leukocytosis ABG showed pH of 7.24, PCO2 of 73.6, PO2 of 56 Chest x-ray showed emphysematous changes CT head unremarkable UDS positive for benzos, takes Valium at home UA negative 1 dose of Narcan given in the ED Avoid any sedatives Admit to progressive unit, monitor closely  Acute hypercapnic respiratory failure History of COPD No wheezing noted ABG showed pH of 7.24, PCO2 of 73.6, PO2 of 56 Chest x-ray showed emphysematous changes, no pneumonia On BiPAP, continue Bronchodilators, supplemental O2 once weaned off BiPAP PCCM consulted  Chronic pain syndrome Anxiety Chronically on Suboxone and Valium at home Follows up with a pain management specialist as an outpatient Hold both Suboxone and Valium for now until off BiPAP to avoid withdrawal  History of ulcerative colitis s/p colostomy Denies any abdominal pain,  no fever, leukocytosis WOC consulted Continue PPI, antiemetics      DVT prophylaxis: Lovenox  Code Status: Full  Family Communication: None at bedside  Disposition Plan: Home (shelter)  Consults called: PCCM  Admission status: Observation    Alma Friendly MD [facility]   If 7PM-7AM, please contact night-coverage [email or web address]  11/04/2020, 5:58 PM

## 2020-11-04 NOTE — ED Notes (Signed)
Pt resting in bed. Now marginally more alert but remains somnolent. Bipap in place. Colostomy bag ruptured. Pt cleans and damp cloth placed over stoma. Awaiting supplies from central supply in order to change.

## 2020-11-04 NOTE — ED Notes (Signed)
Changed pt ostomy bag.

## 2020-11-05 ENCOUNTER — Observation Stay (HOSPITAL_COMMUNITY): Payer: Medicaid Other

## 2020-11-05 DIAGNOSIS — G894 Chronic pain syndrome: Secondary | ICD-10-CM | POA: Diagnosis present

## 2020-11-05 DIAGNOSIS — Z888 Allergy status to other drugs, medicaments and biological substances status: Secondary | ICD-10-CM | POA: Diagnosis not present

## 2020-11-05 DIAGNOSIS — Z20822 Contact with and (suspected) exposure to covid-19: Secondary | ICD-10-CM | POA: Diagnosis present

## 2020-11-05 DIAGNOSIS — F419 Anxiety disorder, unspecified: Secondary | ICD-10-CM | POA: Diagnosis present

## 2020-11-05 DIAGNOSIS — T424X1A Poisoning by benzodiazepines, accidental (unintentional), initial encounter: Secondary | ICD-10-CM | POA: Diagnosis present

## 2020-11-05 DIAGNOSIS — J439 Emphysema, unspecified: Secondary | ICD-10-CM | POA: Diagnosis present

## 2020-11-05 DIAGNOSIS — G9341 Metabolic encephalopathy: Secondary | ICD-10-CM | POA: Diagnosis not present

## 2020-11-05 DIAGNOSIS — Z79899 Other long term (current) drug therapy: Secondary | ICD-10-CM | POA: Diagnosis not present

## 2020-11-05 DIAGNOSIS — Z885 Allergy status to narcotic agent status: Secondary | ICD-10-CM | POA: Diagnosis not present

## 2020-11-05 DIAGNOSIS — Z933 Colostomy status: Secondary | ICD-10-CM | POA: Diagnosis not present

## 2020-11-05 DIAGNOSIS — J449 Chronic obstructive pulmonary disease, unspecified: Secondary | ICD-10-CM | POA: Diagnosis not present

## 2020-11-05 DIAGNOSIS — J9622 Acute and chronic respiratory failure with hypercapnia: Secondary | ICD-10-CM | POA: Diagnosis present

## 2020-11-05 DIAGNOSIS — T40491A Poisoning by other synthetic narcotics, accidental (unintentional), initial encounter: Secondary | ICD-10-CM | POA: Diagnosis not present

## 2020-11-05 DIAGNOSIS — Z9049 Acquired absence of other specified parts of digestive tract: Secondary | ICD-10-CM | POA: Diagnosis not present

## 2020-11-05 DIAGNOSIS — Z880 Allergy status to penicillin: Secondary | ICD-10-CM | POA: Diagnosis not present

## 2020-11-05 DIAGNOSIS — J9621 Acute and chronic respiratory failure with hypoxia: Secondary | ICD-10-CM | POA: Diagnosis present

## 2020-11-05 DIAGNOSIS — Y92009 Unspecified place in unspecified non-institutional (private) residence as the place of occurrence of the external cause: Secondary | ICD-10-CM | POA: Diagnosis not present

## 2020-11-05 DIAGNOSIS — F1120 Opioid dependence, uncomplicated: Secondary | ICD-10-CM | POA: Diagnosis present

## 2020-11-05 DIAGNOSIS — J9602 Acute respiratory failure with hypercapnia: Secondary | ICD-10-CM | POA: Diagnosis not present

## 2020-11-05 DIAGNOSIS — J9601 Acute respiratory failure with hypoxia: Secondary | ICD-10-CM | POA: Diagnosis not present

## 2020-11-05 DIAGNOSIS — I4510 Unspecified right bundle-branch block: Secondary | ICD-10-CM | POA: Diagnosis present

## 2020-11-05 DIAGNOSIS — G928 Other toxic encephalopathy: Secondary | ICD-10-CM | POA: Diagnosis present

## 2020-11-05 DIAGNOSIS — Z882 Allergy status to sulfonamides status: Secondary | ICD-10-CM | POA: Diagnosis not present

## 2020-11-05 LAB — COMPREHENSIVE METABOLIC PANEL
ALT: 11 U/L (ref 0–44)
AST: 16 U/L (ref 15–41)
Albumin: 2.8 g/dL — ABNORMAL LOW (ref 3.5–5.0)
Alkaline Phosphatase: 77 U/L (ref 38–126)
Anion gap: 8 (ref 5–15)
BUN: 14 mg/dL (ref 8–23)
CO2: 31 mmol/L (ref 22–32)
Calcium: 8.6 mg/dL — ABNORMAL LOW (ref 8.9–10.3)
Chloride: 99 mmol/L (ref 98–111)
Creatinine, Ser: 0.64 mg/dL (ref 0.44–1.00)
GFR, Estimated: >60 mL/min (ref >60)
Glucose, Bld: 140 mg/dL — ABNORMAL HIGH (ref 70–99)
Potassium: 4 mmol/L (ref 3.5–5.1)
Sodium: 138 mmol/L (ref 135–145)
Total Bilirubin: 0.4 mg/dL (ref 0.3–1.2)
Total Protein: 4.8 g/dL — ABNORMAL LOW (ref 6.5–8.1)

## 2020-11-05 LAB — CBC
HCT: 38.6 % (ref 36.0–46.0)
Hemoglobin: 11.5 g/dL — ABNORMAL LOW (ref 12.0–15.0)
MCH: 28.2 pg (ref 26.0–34.0)
MCHC: 29.8 g/dL — ABNORMAL LOW (ref 30.0–36.0)
MCV: 94.6 fL (ref 80.0–100.0)
Platelets: 129 10*3/uL — ABNORMAL LOW (ref 150–400)
RBC: 4.08 MIL/uL (ref 3.87–5.11)
RDW: 15.5 % (ref 11.5–15.5)
WBC: 4 10*3/uL (ref 4.0–10.5)
nRBC: 0 % (ref 0.0–0.2)

## 2020-11-05 MED ORDER — BUPRENORPHINE HCL-NALOXONE HCL 8-2 MG SL SUBL
1.0000 | SUBLINGUAL_TABLET | Freq: Two times a day (BID) | SUBLINGUAL | Status: DC
  Administered 2020-11-05 – 2020-11-07 (×5): 1 via SUBLINGUAL
  Filled 2020-11-05 (×5): qty 1

## 2020-11-05 MED ORDER — ACETAMINOPHEN 500 MG PO TABS
1000.0000 mg | ORAL_TABLET | Freq: Three times a day (TID) | ORAL | Status: DC | PRN
  Administered 2020-11-05 – 2020-11-07 (×5): 1000 mg via ORAL
  Filled 2020-11-05 (×5): qty 2

## 2020-11-05 MED ORDER — DIAZEPAM 5 MG PO TABS
5.0000 mg | ORAL_TABLET | Freq: Two times a day (BID) | ORAL | Status: DC | PRN
  Administered 2020-11-05 – 2020-11-07 (×5): 5 mg via ORAL
  Filled 2020-11-05 (×5): qty 1

## 2020-11-05 MED ORDER — SODIUM CHLORIDE 0.9 % IV BOLUS: 500.0000 mL | Freq: Once | INTRAVENOUS | Status: DC

## 2020-11-05 NOTE — Consult Note (Signed)
[place] Nurse ostomy follow up Patient receiving care in [place]. Stoma type/location: RUQ ileostomy Stomal assessment/size: approximately 1 inch round; moist Peristomal assessment: deferred--ostomy supplies have to be ordered and obtained Treatment options for stomal/peristomal skin: barrier ring Output: thin greenish  Ostomy pouching: 1pc.flat pouch, Pattricia Boss with barrier ring, Kellie Simmering [phone].  Patient prefers a clamp on the bottom of the pouch. We can try to use our lock and roll and the clamp. Order and have at the bedside, the following ostomy supplies:  Pouch, Kellie Simmering # [id number]; barrier rings, Kellie Simmering # [id number]. CUT THE OPENING JUST LARGE ENOUGH TO FIT AROUND THE STOMA, NO LARGER. The patient prefers a clamp on the end of the pouch.  Use it also.  There are no wounds on any of her toes.  Val Riles, RN, MSN, CWOCN, CNS-BC, pager [phone]
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
## 2020-11-05 NOTE — Progress Notes (Signed)
PROGRESS NOTE  Katelyn Villegas YIR:[id number] DOB: 04/03/1961 DOA: 11/04/2020 PCP: Guadlupe Spanish, MD  HPI/Recap of past 24 hours: Katelyn Villegas is a 59 y.o. female with medical history significant for COPD, ulcerative colitis with colostomy presented to the ER for generalized weakness, altered mental status.  She was brought in by ambulance initially for possible code stroke, but moving all extremities spontaneously.  History was limited by AMS and being on BiPAP.  Per chart review, patient reported pain all over and generalized weakness. Patient was able to answer basic questions, was able to express she had no chest pain, abdominal pain, nausea/vomiting, fever/chills.  Patient was able to lift her extremities, and follow simple commands.  Patient takes Suboxone, Valium for her chronic pain and anxiety.  Patient was recently discharged about a month ago for about similar issues. In the ED, noted to be dyspneic, lethargic, patient was saturating above 90 on room air, other vital signs stable.  Basic lab work unremarkable, ABG showed pH of 7.24, PCO2 of 73.6, PO2 of 56.  Chest x-ray showed emphysematous changes, CT head unremarkable.  UDS positive for benzos, takes Valium at home.  UA negative.  Patient was given 1 dose of Narcan, placed on BiPAP.  [facility] hospitalist called for admission. PCCM consulted.    Today, pt reports generalized pain, complain about her roommate.  She denies any chest pain, abdominal pain, nausea/vomiting, fever/chills.  Patient still requiring oxygen, plan to wean off    Assessment/Plan: Principal Problem:   Acute metabolic encephalopathy Active Problems:   COPD (chronic obstructive pulmonary disease) (HCC)   Ulcerative colitis (HCC)   Acute respiratory failure with hypercapnia (HCC)   Acute metabolic encephalopathy Improved Likely 2/2 medication (takes Suboxone, Valium at home), ?possible accidental OD Currently afebrile, with no leukocytosis ABG showed pH of  7.24, PCO2 of 73.6, PO2 of 56 Chest x-ray showed emphysematous changes CT head unremarkable UDS positive for benzos, takes Valium at home UA negative 1 dose of Narcan given in the ED Monitor closely  Acute on chronic hypercapnic respiratory failure History of COPD No wheezing noted, currently off BiPAP, on supplemental O2 ABG showed pH of 7.24, PCO2 of 73.6, PO2 of 56 Chest x-ray showed emphysematous changes, no pneumonia Bronchodilators, supplemental O2 PCCM consult apprecaited  Chronic pain syndrome with opiate dependence Anxiety Chronically on Suboxone and Valium at home Follows up with a pain management specialist as an outpatient Restart Suboxone and switch Valium to prn for now  History of ulcerative colitis s/p colostomy Denies any abdominal pain, no fever, no leukocytosis WOC consulted Continue PPI, antiemetics       Malnutrition Type:      Malnutrition Characteristics:      Nutrition Interventions:       Estimated body mass index is 19.59 kg/m as calculated from the following:   Height as of 10/28/20: 4' 11"  (1.499 m).   Weight as of this encounter: 44 kg.     Code Status: Full  Family Communication: None at bedside  Disposition Plan: Status is: Observation  The patient will require care spanning > 2 midnights and should be moved to inpatient because: Inpatient level of care appropriate due to severity of illness  Dispo: The patient is from: Home              Anticipated d/c is to: Home (shelter)              Anticipated d/c date is: 1 day  Patient currently is not medically stable to d/c.    Consultants:  PCCM  Procedures:  None  Antimicrobials:  None  DVT prophylaxis: Lovenox   Objective: Vitals:   11/05/20 1004 11/05/20 1006 11/05/20 1027 11/05/20 1112  BP: (!) 88/60 (!) 86/69 (!) 88/58 97/69  Pulse: 73 72  69  Resp:    17  Temp:    98 F (36.7 C)  TempSrc:    Oral  SpO2:    100%  Weight:         Intake/Output Summary (Last 24 hours) at 11/05/2020 1131 Last data filed at 11/05/2020 6203 Gross per 24 hour  Intake 600 ml  Output 0 ml  Net 600 ml   Filed Weights   11/05/20 0422  Weight: 44 kg    Exam:  General: NAD, thin  Cardiovascular: S1, S2 present  Respiratory: CTAB  Abdomen: Soft, nontender, nondistended, bowel sounds present, colostomy bag noted with greenish liquid stool  Musculoskeletal: No bilateral pedal edema noted  Skin: Normal  Psychiatry: Normal mood    Data Reviewed: CBC: Recent Labs  Lab 11/04/20 1045 11/04/20 1419 11/04/20 1933 11/05/20 0716  WBC 4.4  --   --  4.0  HGB 13.2 15.3* 13.3 11.5*  HCT 44.2 45.0 39.0 38.6  MCV 94.6  --   --  94.6  PLT 132*  --   --  559*   Basic Metabolic Panel: Recent Labs  Lab 11/04/20 1045 11/04/20 1419 11/04/20 1933 11/05/20 0716  NA 139 139 140 138  K 4.5 4.6 4.2 4.0  CL 101  --   --  99  CO2 28  --   --  31  GLUCOSE 118*  --   --  140*  BUN 12  --   --  14  CREATININE 0.58  --   --  0.64  CALCIUM 9.4  --   --  8.6*   GFR: Estimated Creatinine Clearance: 50.4 mL/min (by C-G formula based on SCr of 0.64 mg/dL). Liver Function Tests: Recent Labs  Lab 11/04/20 1139 11/05/20 0716  AST 17 16  ALT 12 11  ALKPHOS 93 77  BILITOT 0.5 0.4  PROT 6.4* 4.8*  ALBUMIN 3.7 2.8*   No results for input(s): LIPASE, AMYLASE in the last 168 hours. Recent Labs  Lab 11/04/20 1139  AMMONIA 11   Coagulation Profile: Recent Labs  Lab 11/04/20 1139  INR 1.0   Cardiac Enzymes: No results for input(s): CKTOTAL, CKMB, CKMBINDEX, TROPONINI in the last 168 hours. BNP (last 3 results) No results for input(s): PROBNP in the last 8760 hours. HbA1C: No results for input(s): HGBA1C in the last 72 hours. CBG: Recent Labs  Lab 11/04/20 1038 11/04/20 1224  GLUCAP 108* 104*   Lipid Profile: No results for input(s): CHOL, HDL, LDLCALC, TRIG, CHOLHDL, LDLDIRECT in the last 72 hours. Thyroid Function  Tests: No results for input(s): TSH, T4TOTAL, FREET4, T3FREE, THYROIDAB in the last 72 hours. Anemia Panel: No results for input(s): VITAMINB12, FOLATE, FERRITIN, TIBC, IRON, RETICCTPCT in the last 72 hours. Urine analysis:    Component Value Date/Time   COLORURINE YELLOW 11/04/2020 [place] 11/04/2020 1213   LABSPEC 1.018 11/04/2020 1213   PHURINE 5.0 11/04/2020 1213   GLUCOSEU NEGATIVE 11/04/2020 1213   HGBUR NEGATIVE 11/04/2020 1213   BILIRUBINUR NEGATIVE 11/04/2020 1213   KETONESUR NEGATIVE 11/04/2020 1213   PROTEINUR 30 (A) 11/04/2020 1213   NITRITE NEGATIVE 11/04/2020 [place] 11/04/2020 1213  Sepsis Labs: @LABRCNTIP (procalcitonin:4,lacticidven:4)  ) Recent Results (from the past 240 hour(s))  Resp Panel by RT-PCR (Flu A&B, Covid) Nasopharyngeal Swab     Status: None   Collection Time: 10/28/20 11:03 AM   Specimen: Nasopharyngeal Swab; Nasopharyngeal(NP) swabs in vial transport medium  Result Value Ref Range Status   SARS Coronavirus 2 by RT PCR NEGATIVE NEGATIVE Final    Comment: (NOTE) SARS-CoV-2 target nucleic acids are NOT DETECTED.  The SARS-CoV-2 RNA is generally detectable in upper respiratory specimens during the acute phase of infection. The lowest concentration of SARS-CoV-2 viral copies this assay can detect is 138 copies/mL. A negative result does not preclude SARS-Cov-2 infection and should not be used as the sole basis for treatment or other patient management decisions. A negative result may occur with  improper specimen collection/handling, submission of specimen other than nasopharyngeal swab, presence of viral mutation(s) within the areas targeted by this assay, and inadequate number of viral copies(<138 copies/mL). A negative result must be combined with clinical observations, patient history, and epidemiological information. The expected result is Negative.  Fact Sheet for Patients:   EntrepreneurPulse.com.au  Fact Sheet for Healthcare Providers:  IncredibleEmployment.be  This test is no t yet approved or cleared by the [place] FDA and  has been authorized for detection and/or diagnosis of SARS-CoV-2 by FDA under an Emergency Use Authorization (EUA). This EUA will remain  in effect (meaning this test can be used) for the duration of the COVID-19 declaration under Section 564(b)(1) of the Act, 21 U.S.C.section 360bbb-3(b)(1), unless the authorization is terminated  or revoked sooner.       Influenza A by PCR NEGATIVE NEGATIVE Final   Influenza B by PCR NEGATIVE NEGATIVE Final    Comment: (NOTE) The Xpert Xpress SARS-CoV-2/FLU/RSV plus assay is intended as an aid in the diagnosis of influenza from Nasopharyngeal swab specimens and should not be used as a sole basis for treatment. Nasal washings and aspirates are unacceptable for Xpert Xpress SARS-CoV-2/FLU/RSV testing.  Fact Sheet for Patients: EntrepreneurPulse.com.au  Fact Sheet for Healthcare Providers: IncredibleEmployment.be  This test is not yet approved or cleared by the [place] FDA and has been authorized for detection and/or diagnosis of SARS-CoV-2 by FDA under an Emergency Use Authorization (EUA). This EUA will remain in effect (meaning this test can be used) for the duration of the COVID-19 declaration under Section 564(b)(1) of the Act, 21 U.S.C. section 360bbb-3(b)(1), unless the authorization is terminated or revoked.  Performed at [facility], [place] [address], [place], [place] [address]   Resp Panel by RT-PCR (Flu A&B, Covid) Nasopharyngeal Swab     Status: None   Collection Time: 11/04/20  3:04 PM   Specimen: Nasopharyngeal Swab; Nasopharyngeal(NP) swabs in vial transport medium  Result Value Ref Range Status   SARS Coronavirus 2 by RT PCR NEGATIVE NEGATIVE Final    Comment:  (NOTE) SARS-CoV-2 target nucleic acids are NOT DETECTED.  The SARS-CoV-2 RNA is generally detectable in upper respiratory specimens during the acute phase of infection. The lowest concentration of SARS-CoV-2 viral copies this assay can detect is 138 copies/mL. A negative result does not preclude SARS-Cov-2 infection and should not be used as the sole basis for treatment or other patient management decisions. A negative result may occur with  improper specimen collection/handling, submission of specimen other than nasopharyngeal swab, presence of viral mutation(s) within the areas targeted by this assay, and inadequate number of viral copies(<138 copies/mL). A negative result must be combined with clinical  observations, patient history, and epidemiological information. The expected result is Negative.  Fact Sheet for Patients:  EntrepreneurPulse.com.au  Fact Sheet for Healthcare Providers:  IncredibleEmployment.be  This test is no t yet approved or cleared by the [place] FDA and  has been authorized for detection and/or diagnosis of SARS-CoV-2 by FDA under an Emergency Use Authorization (EUA). This EUA will remain  in effect (meaning this test can be used) for the duration of the COVID-19 declaration under Section 564(b)(1) of the Act, 21 U.S.C.section 360bbb-3(b)(1), unless the authorization is terminated  or revoked sooner.       Influenza A by PCR NEGATIVE NEGATIVE Final   Influenza B by PCR NEGATIVE NEGATIVE Final    Comment: (NOTE) The Xpert Xpress SARS-CoV-2/FLU/RSV plus assay is intended as an aid in the diagnosis of influenza from Nasopharyngeal swab specimens and should not be used as a sole basis for treatment. Nasal washings and aspirates are unacceptable for Xpert Xpress SARS-CoV-2/FLU/RSV testing.  Fact Sheet for Patients: EntrepreneurPulse.com.au  Fact Sheet for Healthcare  Providers: IncredibleEmployment.be  This test is not yet approved or cleared by the [place] FDA and has been authorized for detection and/or diagnosis of SARS-CoV-2 by FDA under an Emergency Use Authorization (EUA). This EUA will remain in effect (meaning this test can be used) for the duration of the COVID-19 declaration under Section 564(b)(1) of the Act, 21 U.S.C. section 360bbb-3(b)(1), unless the authorization is terminated or revoked.  Performed at [facility], [place] [address], [place], [place] [address]       Studies: CT Head Wo Contrast  Result Date: 11/04/2020 CLINICAL DATA:  Acute delirium EXAM: CT HEAD WITHOUT CONTRAST TECHNIQUE: Contiguous axial images were obtained from the base of the skull through the vertex without intravenous contrast. COMPARISON:  10/08/2020, 07/13/2019 FINDINGS: Brain: No evidence of acute infarction, hemorrhage, hydrocephalus, extra-axial collection or mass lesion/mass effect. Vascular: No hyperdense vessel or unexpected calcification. Skull: Normal. Negative for fracture or focal lesion. Sinuses/Orbits: No acute finding. Other: None. IMPRESSION: No acute intracranial abnormality.  Stable head CT without contrast. Electronically Signed   By: Jerilynn Mages.  Shick M.D.   On: 11/04/2020 12:11   DG Chest Portable 1 View  Result Date: 11/04/2020 CLINICAL DATA:  59 year old female with history of confusion. EXAM: PORTABLE CHEST 1 VIEW COMPARISON:  Chest x-ray 10/28/2020. FINDINGS: Hyperexpansion of the lungs with emphysematous changes, similar to prior studies. Skin fold artifact in the right hemithorax. Lung volumes are normal. No consolidative airspace disease. No pleural effusions. No pneumothorax. No pulmonary nodule or mass noted. Pulmonary vasculature and the cardiomediastinal silhouette are within normal limits. IMPRESSION: 1. No radiographic evidence of acute cardiopulmonary disease. 2. Emphysema. Electronically Signed   By: Vinnie Langton M.D.   On: 11/04/2020 14:38    Scheduled Meds: . buprenorphine-naloxone  1 tablet Sublingual BID  . enoxaparin (LOVENOX) injection  40 mg Subcutaneous Q24H  . pantoprazole  40 mg Oral Daily    Continuous Infusions: . sodium chloride       LOS: 0 days     Alma Friendly, MD [facility]  If 7PM-7AM, please contact night-coverage [email or web address] 11/05/2020, 11:31 AM

## 2020-11-05 NOTE — Plan of Care (Signed)
  Problem: Coping: Goal: Level of anxiety will decrease Outcome: Progressing   Problem: Safety: Goal: Ability to remain free from injury will improve Outcome: Progressing   Problem: Pain Managment: Goal: General experience of comfort will improve Outcome: Not Progressing

## 2020-11-05 NOTE — Progress Notes (Signed)
Pt sill had the meds that she took at home, the Suboxone and Valium in her possession, RN explained to the pt that he would have to count and lock up her meds at the inpatient pharmacy, and she would be able to get them back at d/c.  Pt agreed and was ok with this, Thanks Buckner [place].
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
## 2020-11-05 NOTE — Progress Notes (Signed)
Pt's colostomy system was changed. Per pt's instructions, the colostomy opening was cut 1 inch wide. Pt applied the colostomy ring and colostomy bag herself. There is no leak noticed at this time.

## 2020-11-06 LAB — CBC WITH DIFFERENTIAL/PLATELET
Abs Immature Granulocytes: 0.01 10*3/uL (ref 0.00–0.07)
Basophils Absolute: 0 10*3/uL (ref 0.0–0.1)
Basophils Relative: 1 %
Eosinophils Absolute: 0.2 10*3/uL (ref 0.0–0.5)
Eosinophils Relative: 6 %
HCT: 38.8 % (ref 36.0–46.0)
Hemoglobin: 11.7 g/dL — ABNORMAL LOW (ref 12.0–15.0)
Immature Granulocytes: 0 %
Lymphocytes Relative: 24 %
Lymphs Abs: 0.9 10*3/uL (ref 0.7–4.0)
MCH: 28.5 pg (ref 26.0–34.0)
MCHC: 30.2 g/dL (ref 30.0–36.0)
MCV: 94.6 fL (ref 80.0–100.0)
Monocytes Absolute: 0.5 10*3/uL (ref 0.1–1.0)
Monocytes Relative: 14 %
Neutro Abs: 2 10*3/uL (ref 1.7–7.7)
Neutrophils Relative %: 55 %
Platelets: 132 10*3/uL — ABNORMAL LOW (ref 150–400)
RBC: 4.1 MIL/uL (ref 3.87–5.11)
RDW: 15 % (ref 11.5–15.5)
WBC: 3.6 10*3/uL — ABNORMAL LOW (ref 4.0–10.5)
nRBC: 0 % (ref 0.0–0.2)

## 2020-11-06 LAB — BASIC METABOLIC PANEL
Anion gap: 7 (ref 5–15)
BUN: 10 mg/dL (ref 8–23)
CO2: 31 mmol/L (ref 22–32)
Calcium: 8.6 mg/dL — ABNORMAL LOW (ref 8.9–10.3)
Chloride: 102 mmol/L (ref 98–111)
Creatinine, Ser: 0.64 mg/dL (ref 0.44–1.00)
GFR, Estimated: >60 mL/min (ref >60)
Glucose, Bld: 60 mg/dL — ABNORMAL LOW (ref 70–99)
Potassium: 4.2 mmol/L (ref 3.5–5.1)
Sodium: 140 mmol/L (ref 135–145)

## 2020-11-06 MED ORDER — SALINE SPRAY 0.65 % NA SOLN
1.0000 | Freq: Two times a day (BID) | NASAL | Status: DC
  Administered 2020-11-06 – 2020-11-07 (×3): 1 via NASAL
  Filled 2020-11-06: qty 44

## 2020-11-06 MED ORDER — METHOCARBAMOL 500 MG PO TABS
500.0000 mg | ORAL_TABLET | Freq: Three times a day (TID) | ORAL | Status: DC | PRN
  Administered 2020-11-06: 15:00:00 500 mg via ORAL
  Filled 2020-11-06 (×2): qty 1

## 2020-11-06 NOTE — Progress Notes (Signed)
PROGRESS NOTE  Katelyn Villegas DSK:[id number] DOB: 04/15/1961 DOA: 11/04/2020 PCP: Guadlupe Spanish, MD  HPI/Recap of past 24 hours: Katelyn Villegas is a 59 y.o. female with medical history significant for COPD, ulcerative colitis with colostomy presented to the ER for generalized weakness, altered mental status.  She was brought in by ambulance initially for possible code stroke, but moving all extremities spontaneously.  History was limited by AMS and being on BiPAP.  Per chart review, patient reported pain all over and generalized weakness. Patient was able to answer basic questions, was able to express she had no chest pain, abdominal pain, nausea/vomiting, fever/chills.  Patient was able to lift her extremities, and follow simple commands.  Patient takes Suboxone, Valium for her chronic pain and anxiety.  Patient was recently discharged about a month ago for about similar issues. In the ED, noted to be dyspneic, lethargic, patient was saturating above 90 on room air, other vital signs stable.  Basic lab work unremarkable, ABG showed pH of 7.24, PCO2 of 73.6, PO2 of 56.  Chest x-ray showed emphysematous changes, CT head unremarkable.  UDS positive for benzos, takes Valium at home.  UA negative.  Patient was given 1 dose of Narcan, placed on BiPAP.  [facility] hospital[facility] called for admission. PCCM consulted.    Today, patient still reports pain all over/generalized pain.  Denies any new complaints.  Weaned off O2.    Assessment/Plan: Principal Problem:   Acute metabolic encephalopathy Active Problems:   COPD (chronic obstructive pulmonary disease) (HCC)   Ulcerative colitis (HCC)   Acute respiratory failure with hypercapnia (HCC)   Acute metabolic encephalopathy Improved Likely 2/2 medication (takes Suboxone, Valium at home), ?possible accidental OD Currently afebrile, with no leukocytosis ABG showed pH of 7.24, PCO2 of 73.6, PO2 of 56 Chest x-ray showed emphysematous changes CT head  unremarkable UDS positive for benzos, takes Valium at home UA negative 1 dose of Narcan given in the ED Monitor closely  Acute on chronic hypercapnic respiratory failure History of COPD No wheezing noted, currently off BiPAP and weaned off supplemental O2 ABG showed pH of 7.24, PCO2 of 73.6, PO2 of 56 Chest x-ray showed emphysematous changes, no pneumonia Bronchodilators, supplemental O2 PCCM consult apprecaited  Chronic pain syndrome with opiate dependence Anxiety Chronically on Suboxone and Valium at home Follows up with a pain management specialist as an outpatient Restart Suboxone and switch Valium to prn for now  History of ulcerative colitis s/p colostomy Denies any abdominal pain, no fever, no leukocytosis WOC consulted Continue PPI, antiemetics       Malnutrition Type:      Malnutrition Characteristics:      Nutrition Interventions:       Estimated body mass index is 20.24 kg/m as calculated from the following:   Height as of 10/28/20: 4' 11"  (1.499 m).   Weight as of this encounter: 45.5 kg.     Code Status: Full  Family Communication: None at bedside  Disposition Plan: Status is: Inpatient  The patient will require care spanning > 2 midnights and should be moved to inpatient because: Inpatient level of care appropriate due to severity of illness  Dispo: The patient is from: Home              Anticipated d/c is to: Home (shelter), wants to stay in a hotel rather than go back to shelter due to social issues              Anticipated d/c date is: 1  day              Patient currently is not medically stable to d/c.    Consultants:  PCCM  Procedures:  None  Antimicrobials:  None  DVT prophylaxis: Lovenox   Objective: Vitals:   11/06/20 0426 11/06/20 0714 11/06/20 1114 11/06/20 1619  BP: (!) 98/53 93/61 123/85 126/78  Pulse: (!) 52 (!) 59 76 77  Resp: 16 17 17 18   Temp: 98.1 F (36.7 C) 98.5 F (36.9 C) 98 F (36.7 C) 98.3  F (36.8 C)  TempSrc: Oral Oral Oral Oral  SpO2: 98% 98% 93% 90%  Weight:        Intake/Output Summary (Last 24 hours) at 11/06/2020 1716 Last data filed at 11/06/2020 1629 Gross per 24 hour  Intake 918 ml  Output 2650 ml  Net -1732 ml   Filed Weights   11/05/20 0422 11/06/20 0040  Weight: 44 kg 45.5 kg    Exam:  General: NAD, thin  Cardiovascular: S1, S2 present  Respiratory: CTAB  Abdomen: Soft, nontender, nondistended, bowel sounds present, colostomy bag noted with brownish liquid stool  Musculoskeletal: No bilateral pedal edema noted  Skin: Normal  Psychiatry: Normal mood    Data Reviewed: CBC: Recent Labs  Lab 11/04/20 1045 11/04/20 1419 11/04/20 1933 11/05/20 0716 11/06/20 0452  WBC 4.4  --   --  4.0 3.6*  NEUTROABS  --   --   --   --  2.0  HGB 13.2 15.3* 13.3 11.5* 11.7*  HCT 44.2 45.0 39.0 38.6 38.8  MCV 94.6  --   --  94.6 94.6  PLT 132*  --   --  129* 810*   Basic Metabolic Panel: Recent Labs  Lab 11/04/20 1045 11/04/20 1419 11/04/20 1933 11/05/20 0716 11/06/20 0622  NA 139 139 140 138 140  K 4.5 4.6 4.2 4.0 4.2  CL 101  --   --  99 102  CO2 28  --   --  31 31  GLUCOSE 118*  --   --  140* 60*  BUN 12  --   --  14 10  CREATININE 0.58  --   --  0.64 0.64  CALCIUM 9.4  --   --  8.6* 8.6*   GFR: Estimated Creatinine Clearance: 50.4 mL/min (by C-G formula based on SCr of 0.64 mg/dL). Liver Function Tests: Recent Labs  Lab 11/04/20 1139 11/05/20 0716  AST 17 16  ALT 12 11  ALKPHOS 93 77  BILITOT 0.5 0.4  PROT 6.4* 4.8*  ALBUMIN 3.7 2.8*   No results for input(s): LIPASE, AMYLASE in the last 168 hours. Recent Labs  Lab 11/04/20 1139  AMMONIA 11   Coagulation Profile: Recent Labs  Lab 11/04/20 1139  INR 1.0   Cardiac Enzymes: No results for input(s): CKTOTAL, CKMB, CKMBINDEX, TROPONINI in the last 168 hours. BNP (last 3 results) No results for input(s): PROBNP in the last 8760 hours. HbA1C: No results for input(s):  HGBA1C in the last 72 hours. CBG: Recent Labs  Lab 11/04/20 1038 11/04/20 1224  GLUCAP 108* 104*   Lipid Profile: No results for input(s): CHOL, HDL, LDLCALC, TRIG, CHOLHDL, LDLDIRECT in the last 72 hours. Thyroid Function Tests: No results for input(s): TSH, T4TOTAL, FREET4, T3FREE, THYROIDAB in the last 72 hours. Anemia Panel: No results for input(s): VITAMINB12, FOLATE, FERRITIN, TIBC, IRON, RETICCTPCT in the last 72 hours. Urine analysis:    Component Value Date/Time   COLORURINE YELLOW 11/04/2020 1213   APPEARANCEUR  CLEAR 11/04/2020 1213   LABSPEC 1.018 11/04/2020 1213   PHURINE 5.0 11/04/2020 1213   GLUCOSEU NEGATIVE 11/04/2020 1213   [place] 11/04/[address] 11/04/2020 [place] 11/04/2020 1213   PROTEINUR 30 (A) 11/04/2020 1213   NITRITE NEGATIVE 11/04/2020 1213   LEUKOCYTESUR NEGATIVE 11/04/2020 1213   Sepsis Labs: @LABRCNTIP (procalcitonin:4,lacticidven:4)  ) Recent Results (from the past 240 hour(s))  Resp Panel by RT-PCR (Flu A&B, Covid) Nasopharyngeal Swab     Status: None   Collection Time: 10/28/20 11:03 AM   Specimen: Nasopharyngeal Swab; Nasopharyngeal(NP) swabs in vial transport medium  Result Value Ref Range Status   SARS Coronavirus 2 by RT PCR NEGATIVE NEGATIVE Final    Comment: (NOTE) SARS-CoV-2 target nucleic acids are NOT DETECTED.  The SARS-CoV-2 RNA is generally detectable in upper respiratory specimens during the acute phase of infection. The lowest concentration of SARS-CoV-2 viral copies this assay can detect is 138 copies/mL. A negative result does not preclude SARS-Cov-2 infection and should not be used as the sole basis for treatment or other patient management decisions. A negative result may occur with  improper specimen collection/handling, submission of specimen other than nasopharyngeal swab, presence of viral mutation(s) within the areas targeted by this assay, and inadequate number of  viral copies(<138 copies/mL). A negative result must be combined with clinical observations, patient history, and epidemiological information. The expected result is Negative.  Fact Sheet for Patients:  EntrepreneurPulse.com.au  Fact Sheet for Healthcare Providers:  IncredibleEmployment.be  This test is no t yet approved or cleared by the [place] FDA and  has been authorized for detection and/or diagnosis of SARS-CoV-2 by FDA under an Emergency Use Authorization (EUA). This EUA will remain  in effect (meaning this test can be used) for the duration of the COVID-19 declaration under Section 564(b)(1) of the Act, 21 U.S.C.section 360bbb-3(b)(1), unless the authorization is terminated  or revoked sooner.       Influenza A by PCR NEGATIVE NEGATIVE Final   Influenza B by PCR NEGATIVE NEGATIVE Final    Comment: (NOTE) The Xpert Xpress SARS-CoV-2/FLU/RSV plus assay is intended as an aid in the diagnosis of influenza from Nasopharyngeal swab specimens and should not be used as a sole basis for treatment. Nasal washings and aspirates are unacceptable for Xpert Xpress SARS-CoV-2/FLU/RSV testing.  Fact Sheet for Patients: EntrepreneurPulse.com.au  Fact Sheet for Healthcare Providers: IncredibleEmployment.be  This test is not yet approved or cleared by the [place] FDA and has been authorized for detection and/or diagnosis of SARS-CoV-2 by FDA under an Emergency Use Authorization (EUA). This EUA will remain in effect (meaning this test can be used) for the duration of the COVID-19 declaration under Section 564(b)(1) of the Act, 21 U.S.C. section 360bbb-3(b)(1), unless the authorization is terminated or revoked.  Performed at [facility], [place] [address], [place], [place] [address]   Resp Panel by RT-PCR (Flu A&B, Covid) Nasopharyngeal Swab     Status: None   Collection Time:  11/04/20  3:04 PM   Specimen: Nasopharyngeal Swab; Nasopharyngeal(NP) swabs in vial transport medium  Result Value Ref Range Status   SARS Coronavirus 2 by RT PCR NEGATIVE NEGATIVE Final    Comment: (NOTE) SARS-CoV-2 target nucleic acids are NOT DETECTED.  The SARS-CoV-2 RNA is generally detectable in upper respiratory specimens during the acute phase of infection. The lowest concentration of SARS-CoV-2 viral copies this assay can detect is 138 copies/mL. A negative result does not preclude SARS-Cov-2  infection and should not be used as the sole basis for treatment or other patient management decisions. A negative result may occur with  improper specimen collection/handling, submission of specimen other than nasopharyngeal swab, presence of viral mutation(s) within the areas targeted by this assay, and inadequate number of viral copies(<138 copies/mL). A negative result must be combined with clinical observations, patient history, and epidemiological information. The expected result is Negative.  Fact Sheet for Patients:  EntrepreneurPulse.com.au  Fact Sheet for Healthcare Providers:  IncredibleEmployment.be  This test is no t yet approved or cleared by the [place] FDA and  has been authorized for detection and/or diagnosis of SARS-CoV-2 by FDA under an Emergency Use Authorization (EUA). This EUA will remain  in effect (meaning this test can be used) for the duration of the COVID-19 declaration under Section 564(b)(1) of the Act, 21 U.S.C.section 360bbb-3(b)(1), unless the authorization is terminated  or revoked sooner.       Influenza A by PCR NEGATIVE NEGATIVE Final   Influenza B by PCR NEGATIVE NEGATIVE Final    Comment: (NOTE) The Xpert Xpress SARS-CoV-2/FLU/RSV plus assay is intended as an aid in the diagnosis of influenza from Nasopharyngeal swab specimens and should not be used as a sole basis for treatment. Nasal washings  and aspirates are unacceptable for Xpert Xpress SARS-CoV-2/FLU/RSV testing.  Fact Sheet for Patients: EntrepreneurPulse.com.au  Fact Sheet for Healthcare Providers: IncredibleEmployment.be  This test is not yet approved or cleared by the [place] FDA and has been authorized for detection and/or diagnosis of SARS-CoV-2 by FDA under an Emergency Use Authorization (EUA). This EUA will remain in effect (meaning this test can be used) for the duration of the COVID-19 declaration under Section 564(b)(1) of the Act, 21 U.S.C. section 360bbb-3(b)(1), unless the authorization is terminated or revoked.  Performed at [facility], [place] [address], [place], [place] [address]       Studies: No results found.  Scheduled Meds: . buprenorphine-naloxone  1 tablet Sublingual BID  . enoxaparin (LOVENOX) injection  40 mg Subcutaneous Q24H  . pantoprazole  40 mg Oral Daily  . sodium chloride  1 spray Each Nare BID    Continuous Infusions: . sodium chloride Stopped (11/05/20 1143)     LOS: 1 day     Alma Friendly, MD [facility]  If 7PM-7AM, please contact night-coverage [email or web address] 11/06/2020, 5:16 PM

## 2020-11-06 NOTE — Progress Notes (Signed)
Patient refusing BiPAP for the night.

## 2020-11-06 NOTE — Progress Notes (Signed)
CSW met with pt to discuss disposition. CSW explains that she is #[phone] on section 8 waiting list. Pt still doesn't want to seek a shelter. She will likely go to a hotel in [place] but will need someone to bring her belongings to the hospital. Pt will work on calling friends to see if someone can bring her belonging to the hospital prior to d/c.

## 2020-11-06 NOTE — Plan of Care (Signed)

## 2020-11-06 NOTE — Evaluation (Signed)
Physical Therapy Evaluation and Discharge Patient Details Name: Katelyn Villegas MRN: [id number] DOB: 1961/03/21 Today's Date: 11/06/2020   History of Present Illness  Katelyn Villegas is a 59 y.o. female with medical history significant for COPD, ulcerative colitis with colostomy presented to the ER for generalized weakness, altered mental status. Chest x-ray showed emphysematous changes, CT head unremarkable. UDS positive for benzos. UA negative. Patient was given 1 dose of Narcan.  Clinical Impression  Prior to admission, pt lives with a roommate, but does not plan to return. Social work involved for housing assist. Pt likely fairly close to baseline; ambulating limited room distances with no assistive device at a supervision level. SpO2 91-94% on RA. Further distance limited by generalized, chronic pain. RN present to provide pain medication. No further acute PT needs. Thank you for this consult.    Follow Up Recommendations No PT follow up;Supervision - Intermittent    Equipment Recommendations  None recommended by PT    Recommendations for Other Services       Precautions / Restrictions Precautions Precautions: Other (comment) Precaution Comments: colostomy Restrictions Weight Bearing Restrictions: No      Mobility  Bed Mobility Overal bed mobility: Modified Independent                  Transfers Overall transfer level: Needs assistance Equipment used: None Transfers: Sit to/from Stand Sit to Stand: Supervision            Ambulation/Gait Ambulation/Gait assistance: Supervision Gait Distance (Feet): 15 Feet Assistive device: None Gait Pattern/deviations: Step-through pattern;Decreased stride length Gait velocity: decreased   General Gait Details: No gross instability noted, supervision for safety  Stairs            Wheelchair Mobility    Modified Rankin (Stroke Patients Only)       Balance Overall balance assessment: Mild deficits observed,  not formally tested                                           Pertinent Vitals/Pain Pain Assessment: Faces Faces Pain Scale: Hurts even more Pain Location: "from head to toe." Pain Descriptors / Indicators: Grimacing;Guarding Pain Intervention(s): Limited activity within patient's tolerance;Monitored during session;Patient requesting pain meds-RN notified    Home Living Family/patient expects to be discharged to:: Shelter/Homeless                      Prior Function Level of Independence: Independent         Comments: Ambulates without DME. Homeless, but recently living with roommate she does not plan to d/c home with     Hand Dominance        Extremity/Trunk Assessment   Upper Extremity Assessment Upper Extremity Assessment: Defer to OT evaluation    Lower Extremity Assessment Lower Extremity Assessment: Overall WFL for tasks assessed       Communication   Communication: No difficulties  Cognition Arousal/Alertness: Awake/alert Behavior During Therapy: WFL for tasks assessed/performed Overall Cognitive Status: No family/caregiver present to determine baseline cognitive functioning                                 General Comments: Pt oriented, follows all commands, easily distracted by internal factors i.e. pain and also tangential      General Comments  Exercises     Assessment/Plan    PT Assessment Patent does not need any further PT services  PT Problem List         PT Treatment Interventions      PT Goals (Current goals can be found in the Care Plan section)  Acute Rehab PT Goals Patient Stated Goal: less pain, find a place to live PT Goal Formulation: All assessment and education complete, DC therapy    Frequency     Barriers to discharge        Co-evaluation               AM-PAC PT "6 Clicks" Mobility  Outcome Measure Help needed turning from your back to your side while in a flat  bed without using bedrails?: None Help needed moving from lying on your back to sitting on the side of a flat bed without using bedrails?: None Help needed moving to and from a bed to a chair (including a wheelchair)?: None Help needed standing up from a chair using your arms (e.g., wheelchair or bedside chair)?: None Help needed to walk in hospital room?: None Help needed climbing 3-5 steps with a railing? : A Little 6 Click Score: 23    End of Session Equipment Utilized During Treatment: Gait belt Activity Tolerance: Patient limited by pain Patient left: in chair;with call bell/phone within reach;with chair alarm set Nurse Communication: Mobility status PT Visit Diagnosis: Pain Pain - part of body:  (generalized)    Time: 3220-2542 PT Time Calculation (min) (ACUTE ONLY): 10 min   Charges:   PT Evaluation $PT Eval Moderate Complexity: 1 Mod          Wyona Almas, PT, DPT Acute Rehabilitation Services Pager [phone] Office [phone]   Deno Etienne 11/06/2020, 9:30 AM

## 2020-11-06 NOTE — Evaluation (Addendum)
Occupational Therapy Evaluation Patient Details Name: Katelyn Villegas MRN: [id number] DOB: [birth year] Today's Date: 11/06/2020    History of Present Illness Katelyn Villegas is a 59 y.o. female with medical history significant for COPD, ulcerative colitis with colostomy presented to the ER for generalized weakness, altered mental status. Chest x-ray showed emphysematous changes, CT head unremarkable. UDS positive for benzos. UA negative. Patient was given 1 dose of Narcan.   Clinical Impression   PTA, pt was living with a roommate and was independent with ADLs; pt not planning to dc back to prior living situation and currently homeless. Pt performing ADLs at [place] A level and functional mobility at [facility] A level. Pt presenting with decreased strength, functional use of RUE, and activity tolerance. Pt with decreased active wrist extension and composite finger extension. SpO2 maintaining >91% on RA. Pt would benefit from further acute OT to facilitate safe dc. Pending pt progress and recovery at R hand, recommend dc to home with follow up at OP for further OT to optimize safety, independence with ADLs, and return to PLOF.     Follow Up Recommendations  Outpatient OT;Supervision - Intermittent (Pending progress and RUE)    Equipment Recommendations  None recommended by OT    Recommendations for Other Services PT consult     Precautions / Restrictions Precautions Precautions: Other (comment) Precaution Comments: colostomy      Mobility Bed Mobility Overal bed mobility: Modified Independent                  Transfers Overall transfer level: Needs assistance Equipment used: None Transfers: Sit to/from Stand Sit to Stand: Min guard              Balance Overall balance assessment: Mild deficits observed, not formally tested                                         ADL either performed or assessed with clinical judgement   ADL Overall ADL's : Needs  assistance/impaired Eating/Feeding: Set up;Sitting   Grooming: Set up;Supervision/safety;Sitting   Upper Body Bathing: Minimal assistance;Sitting   Lower Body Bathing: Minimal assistance;Sit to/from stand   Upper Body Dressing : Minimal assistance;Sitting   Lower Body Dressing: Sit to/from stand;Minimal assistance   Toilet Transfer: +2 for safety/equipment;Ambulation;Min guard (simulated to recliner)           Functional mobility during ADLs: +2 for safety/equipment;Min guard General ADL Comments: Pt presenting with decreased balance, strength, and activity tolerance. Pt also with limited FM coorindation and strength at R hand     Vision         Perception     Praxis      Pertinent Vitals/Pain Pain Assessment: Faces Faces Pain Scale: Hurts even more Pain Location: "from head to toe." Pain Descriptors / Indicators: Grimacing;Guarding Pain Intervention(s): Monitored during session;Limited activity within patient's tolerance;Repositioned     Hand Dominance Right   Extremity/Trunk Assessment Upper Extremity Assessment Upper Extremity Assessment: RUE deficits/detail RUE Deficits / Details: Difficulty with active wrist extension and composite extension. Slight painful for PROM at wrist and digits. Reports decreased sensation stating "I can't feel at that hand, but I can feel too". Reports more at dorsal portion of hand RUE Coordination: decreased fine motor   Lower Extremity Assessment Lower Extremity Assessment: Defer to PT evaluation   Cervical / Trunk Assessment Cervical / Trunk Assessment: Normal  Communication Communication Communication: No difficulties   Cognition Arousal/Alertness: Awake/alert Behavior During Therapy: WFL for tasks assessed/performed Overall Cognitive Status: No family/caregiver present to determine baseline cognitive functioning                                 General Comments: Pt oriented, follows all commands, easily  distracted by internal factors i.e. pain and also tangential   General Comments  SpO2 94% on RA    Exercises     Shoulder Instructions      Home Living Family/patient expects to be discharged to:: Shelter/Homeless                                 Additional Comments: Does not plan to dc back top prior living situation as she does not feel safe      Prior Functioning/Environment Level of Independence: Independent        Comments: Ambulates without DME. Homeless, but recently living with roommate she does not plan to d/c home with        OT Problem List: Decreased strength;Impaired balance (sitting and/or standing);Decreased activity tolerance;Decreased range of motion;Decreased knowledge of use of DME or AE;Decreased knowledge of precautions;Cardiopulmonary status limiting activity;Pain;Impaired UE functional use      OT Treatment/Interventions: Self-care/ADL training;Therapeutic exercise;Energy conservation;DME and/or AE instruction;Therapeutic activities;Patient/family education    OT Goals(Current goals can be found in the care plan section) Acute Rehab OT Goals Patient Stated Goal: less pain, find a place to live OT Goal Formulation: With patient Time For Goal Achievement: 11/20/20 Potential to Achieve Goals: Good  OT Frequency: Min 2X/week   Barriers to D/C:            Co-evaluation              AM-PAC OT "6 Clicks" Daily Activity     Outcome Measure Help from another person eating meals?: None Help from another person taking care of personal grooming?: A Little Help from another person toileting, which includes using toliet, bedpan, or urinal?: A Little Help from another person bathing (including washing, rinsing, drying)?: A Little Help from another person to put on and taking off regular upper body clothing?: A Little Help from another person to put on and taking off regular lower body clothing?: A Little 6 Click Score: 19   End of  Session Equipment Utilized During Treatment: Gait belt Nurse Communication: Mobility status  Activity Tolerance: Patient tolerated treatment well Patient left: in chair;with call bell/phone within reach;with chair alarm set;with nursing/sitter in room  OT Visit Diagnosis: Unsteadiness on feet (R26.81);Other abnormalities of gait and mobility (R26.89);Muscle weakness (generalized) (M62.81);Pain Pain - Right/Left: Right Pain - part of body: Hand                Time: 8466-5993 OT Time Calculation (min): 22 min Charges:  OT General Charges $OT Visit: 1 Visit OT Evaluation $OT Eval Moderate Complexity: Gem, OTR/L Acute Rehab Pager: [phone] Office: [place] 11/06/2020, 2:23 PM

## 2020-11-07 ENCOUNTER — Other Ambulatory Visit (HOSPITAL_COMMUNITY): Payer: Self-pay | Admitting: Internal Medicine

## 2020-11-07 LAB — BASIC METABOLIC PANEL
Anion gap: 7 (ref 5–15)
BUN: 7 mg/dL — ABNORMAL LOW (ref 8–23)
CO2: 31 mmol/L (ref 22–32)
Calcium: 8.9 mg/dL (ref 8.9–10.3)
Chloride: 101 mmol/L (ref 98–111)
Creatinine, Ser: 0.54 mg/dL (ref 0.44–1.00)
GFR, Estimated: >60 mL/min (ref >60)
Glucose, Bld: 73 mg/dL (ref 70–99)
Potassium: 4.4 mmol/L (ref 3.5–5.1)
Sodium: 139 mmol/L (ref 135–145)

## 2020-11-07 LAB — CBC WITH DIFFERENTIAL/PLATELET
Abs Immature Granulocytes: 0.01 10*3/uL (ref 0.00–0.07)
Basophils Absolute: 0 10*3/uL (ref 0.0–0.1)
Basophils Relative: 1 %
Eosinophils Absolute: 0.2 10*3/uL (ref 0.0–0.5)
Eosinophils Relative: 5 %
HCT: 36.6 % (ref 36.0–46.0)
Hemoglobin: 11.8 g/dL — ABNORMAL LOW (ref 12.0–15.0)
Immature Granulocytes: 0 %
Lymphocytes Relative: 23 %
Lymphs Abs: 0.7 10*3/uL (ref 0.7–4.0)
MCH: 29.5 pg (ref 26.0–34.0)
MCHC: 32.2 g/dL (ref 30.0–36.0)
MCV: 91.5 fL (ref 80.0–100.0)
Monocytes Absolute: 0.4 10*3/uL (ref 0.1–1.0)
Monocytes Relative: 12 %
Neutro Abs: 1.9 10*3/uL (ref 1.7–7.7)
Neutrophils Relative %: 59 %
Platelets: 135 10*3/uL — ABNORMAL LOW (ref 150–400)
RBC: 4 MIL/uL (ref 3.87–5.11)
RDW: 14.9 % (ref 11.5–15.5)
WBC: 3.2 10*3/uL — ABNORMAL LOW (ref 4.0–10.5)
nRBC: 0 % (ref 0.0–0.2)

## 2020-11-07 MED ORDER — ALBUTEROL SULFATE HFA 108 (90 BASE) MCG/ACT IN AERS: 1.0000 | INHALATION_SPRAY | Freq: Four times a day (QID) | RESPIRATORY_TRACT | 1 refills | Status: DC | PRN

## 2020-11-07 MED ORDER — IPRATROPIUM-ALBUTEROL 0.5-2.5 (3) MG/3ML IN SOLN: 3.0000 mL | Freq: Four times a day (QID) | RESPIRATORY_TRACT | 12 refills | Status: AC | PRN

## 2020-11-07 MED FILL — PROAIR HFA 90 MCG INHALER: 108 (90 BAS | 25 days supply | Qty: 9 | Fill #0

## 2020-11-07 NOTE — Progress Notes (Signed)
SATURATION QUALIFICATIONS: (This note is used to comply with regulatory documentation for home oxygen)  Patient Saturations on Room Air at Rest =   Patient Saturations on Room Air while Ambulating = 91-94%  Patient Saturations on  Liters of oxygen while Ambulating =   Please briefly explain why patient needs home oxygen: No oxygen required

## 2020-11-07 NOTE — Progress Notes (Signed)
Patient placed self back on O2 via nasal cannula citing a general ill feeling. States that relief was achieved with O2. Vital signs obtained, O2 sat WNL on 2L [place]. Lungs diminished but otherwise clear to auscultation. Notified MD of increased oxygen demand. Received order to monitor for further deterioration.

## 2020-11-07 NOTE — TOC Progression Note (Signed)
Transition of Care [facility]) - Progression Note    Patient Details  Name: Katelyn Villegas MRN: [id number] Date of Birth: 1961-11-01  Transition of Care [facility]) CM/SW [place], [place] Phone Number: 11/07/2020, 2:08 PM  Clinical Narrative:     CSW spoke with pt regarding d/c plans. Pt has decided she will go to [facility] in [place]. She will then ask [facility] office to help with to get her belongings from her old roommate.   CSW arranged cone transport.   Expected Discharge Plan: Home/Self Care Barriers to Discharge: Homeless with medical needs  Expected Discharge Plan and Services Expected Discharge Plan: Home/Self Care       Living arrangements for the past 2 months: Single Family Home Expected Discharge Date: 11/07/20                                     Social Determinants of Health (SDOH) Interventions    Readmission Risk Interventions No flowsheet data found.

## 2020-11-07 NOTE — Progress Notes (Signed)
D/C instructions given and reviewed. No questions asked but encouraged to call with any concerns. Tele and IV removed, tolerated well. 

## 2020-11-07 NOTE — Plan of Care (Signed)

## 2020-11-08 NOTE — Discharge Summary (Signed)
Physician Discharge Summary  Katelyn Villegas LAG:[id number] DOB: 27-May-1961 DOA: 11/04/2020  PCP: Katelyn Spanish, MD  Admit date: 11/04/2020 Discharge date: 11/07/2020  Admitted From: Home.  Disposition: Home.   Recommendations for Outpatient Follow-up:  1. Follow up with PCP in 1-2 weeks 2. Please obtain BMP/CBC in one week   Discharge Condition:stable.  CODE STATUS: full code.  Diet recommendation: Heart Healthy   Brief/Interim Summary: Katelyn Villegas a 59 y.o.femalewith medical history significant forCOPD, ulcerative colitis with colostomy presented to the ER for generalized weakness, altered mental status.She was brought in by ambulance initially for possible code stroke,but moving all extremities spontaneously.History was limited by AMS and being on BiPAP. Per chart review, patient reported pain all over and generalized weakness. Patient was able to answer basic questions,was able to express she had no chest pain, abdominal pain, nausea/vomiting, fever/chills. Patient was able to lift her extremities,and follow simple commands. Patient takes Suboxone, Valium for her chronic pain and anxiety. Patient was recently discharged about a month ago for about similar issues. In the ED, noted to be dyspneic, lethargic, patient was saturating above 90 on room air, other vital signs stable. Basic lab work unremarkable, ABG showed pH of 7.24, PCO2 of 73.6, PO2 of 56.Chest x-ray showed emphysematous changes,CT head unremarkable.UDS positive for benzos, takes Valium at home.UA negative. Patient was given 1 dose of Narcan,placed on BiPAP.[facility] hospital[facility] called for admission. PCCM consulted.   Discharge Diagnoses:  Principal Problem:   Acute metabolic encephalopathy Active Problems:   COPD (chronic obstructive pulmonary disease) (HCC)   Ulcerative colitis (HCC)   Acute respiratory failure with hypercapnia (HCC)   Acute metabolic encephalopathy Improved Likely 2/2  medication(takes Suboxone, Valium at home), ?possible accidental OD Currently afebrile, with no leukocytosis ABG showed pH of 7.24, PCO2 of 73.6, PO2 of 56 Chest x-ray showed emphysematous changes CT head unremarkable UDS positive for benzos, takes Valium at home UA negative Resolved.   Acute on chronic hypercapnic respiratory failure History of COPD No wheezing noted, currently off BiPAP and weaned off supplemental O2.  ABG showed pH of 7.24, PCO2 of 73.6, PO2 of 56 Chest x-ray showed emphysematous changes,no pneumonia Bronchodilators, supplemental O2   Chronic pain syndrome with opiate dependence Anxiety Chronically on Suboxone and Valium at home Follows up with a pain management specialist as an outpatient Restart Suboxone and switch Valium to prn for now  History of ulcerative colitis s/p colostomy Deniesany abdominal pain, no fever,no leukocytosis WOC consulted Continue PPI, antiemetics  Discharge Instructions  Discharge Instructions    Diet - low sodium heart healthy   Complete by: As directed    Discharge instructions   Complete by: As directed    Please follow up with PCP in one week.     Allergies as of 11/07/2020      Reactions   Morphine    Unknown reaction   Procaine Other (See Comments)   BREATHING ISSUE   Aspirin    ULCERS   Sulfa Antibiotics Itching   Sulfamethoxazole Other (See Comments)   Unknown reaction   Meperidine Rash   Penicillins Itching   Tolerated cephalosporins      Medication List    STOP taking these medications   ondansetron 4 MG tablet Commonly known as: ZOFRAN     TAKE these medications   acetaminophen 500 MG tablet Commonly known as: TYLENOL Take 500 mg by mouth every 6 (six) hours as needed for moderate pain or headache.   albuterol 108 (90 Base) MCG/ACT inhaler Commonly  known as: VENTOLIN HFA Inhale 1-2 puffs into the lungs every 6 (six) hours as needed for wheezing or shortness of breath.    buprenorphine-naloxone 8-2 mg Subl SL tablet Commonly known as: SUBOXONE Place 1 tablet under the tongue 2 (two) times daily.   CLEAR EYES COMPLETE OP Place 1 drop into both eyes daily as needed (dry eyes).   dexlansoprazole 60 MG capsule Commonly known as: DEXILANT Take 1 capsule (60 mg total) by mouth daily.   diazepam 10 MG tablet Commonly known as: VALIUM Take 10 mg by mouth 2 (two) times daily as needed for anxiety.   ipratropium-albuterol 0.5-2.5 (3) MG/3ML Soln Commonly known as: DUONEB Take 3 mLs by nebulization 4 (four) times daily as needed (shortness of breath).       Follow-up Information    Katelyn Spanish, MD. Schedule an appointment as soon as possible for a visit in 1 week(s).   Specialty: Internal Medicine Contact information: [place] [address] [phone]              Allergies  Allergen Reactions  . Morphine     Unknown reaction   . Procaine Other (See Comments)    BREATHING ISSUE   . Aspirin     ULCERS   . Sulfa Antibiotics Itching  . Sulfamethoxazole Other (See Comments)    Unknown reaction  . Meperidine Rash  . Penicillins Itching    Tolerated cephalosporins     Consultations:  None.    Procedures/Studies: CT Head Wo Contrast  Result Date: 11/04/2020 CLINICAL DATA:  Acute delirium EXAM: CT HEAD WITHOUT CONTRAST TECHNIQUE: Contiguous axial images were obtained from the base of the skull through the vertex without intravenous contrast. COMPARISON:  10/08/2020, 07/13/2019 FINDINGS: Brain: No evidence of acute infarction, hemorrhage, hydrocephalus, extra-axial collection or mass lesion/mass effect. Vascular: No hyperdense vessel or unexpected calcification. Skull: Normal. Negative for fracture or focal lesion. Sinuses/Orbits: No acute finding. Other: None. IMPRESSION: No acute intracranial abnormality.  Stable head CT without contrast. Electronically Signed   By: Jerilynn Mages.  Shick M.D.   On: 11/04/2020 12:11   DG Hand  2 View Right  Result Date: 11/05/2020 CLINICAL DATA:  Right hand pain. EXAM: RIGHT HAND - 2 VIEW COMPARISON:  None. FINDINGS: Mild-to-moderate interphalangeal joint degenerative changes. Mild degenerative changes involving the first, second and third MTP joints with mild joint space narrowing and early spurring. The [facility] joint of the thumb is maintained. The radiocarpal and intercarpal joint spaces are maintained. No significant degenerative or erosive findings. No chondrocalcinosis. No acute bony findings are identified. IMPRESSION: Mild degenerative changes but no acute bony findings. Electronically Signed   By: Marijo Sanes M.D.   On: 11/05/2020 13:18   DG Chest Portable 1 View  Result Date: 11/04/2020 CLINICAL DATA:  59 year old female with history of confusion. EXAM: PORTABLE CHEST 1 VIEW COMPARISON:  Chest x-ray 10/28/2020. FINDINGS: Hyperexpansion of the lungs with emphysematous changes, similar to prior studies. Skin fold artifact in the right hemithorax. Lung volumes are normal. No consolidative airspace disease. No pleural effusions. No pneumothorax. No pulmonary nodule or mass noted. Pulmonary vasculature and the cardiomediastinal silhouette are within normal limits. IMPRESSION: 1. No radiographic evidence of acute cardiopulmonary disease. 2. Emphysema. Electronically Signed   By: Vinnie Langton M.D.   On: 11/04/2020 14:38   DG Chest Port 1 View  Result Date: 10/28/2020 CLINICAL DATA:  Shortness of breath EXAM: PORTABLE CHEST 1 VIEW COMPARISON:  September 10, 2020 FINDINGS: No pneumothorax. The  cardiomediastinal silhouette is stable. No pulmonary nodules or masses. No focal infiltrates. No overt edema. IMPRESSION: No active disease. Electronically Signed   By: Dorise Bullion III M.D   On: 10/28/2020 11:50       Subjective: No new complaints.   Discharge Exam: Vitals:   11/07/20 0719 11/07/20 1141  BP: 99/60 133/87  Pulse: 61 84  Resp: 17 17  Temp: 98 F (36.7 C) 98 F (36.7  C)  SpO2: 100% 96%   Vitals:   11/07/20 0036 11/07/20 0341 11/07/20 0719 11/07/20 1141  BP: 134/83 111/78 99/60 133/87  Pulse: 71 66 61 84  Resp: 19 20 17 17   Temp: 98.2 F (36.8 C) 98.4 F (36.9 C) 98 F (36.7 C) 98 F (36.7 C)  TempSrc: Oral Oral Oral Oral  SpO2: 100% 96% 100% 96%  Weight:  44.3 kg      General: Pt is alert, awake, not in acute distress Cardiovascular: RRR, S1/S2 +, no rubs, no gallops Respiratory: CTA bilaterally, no wheezing, no rhonchi Abdominal: Soft, NT, ND, bowel sounds + Extremities: no edema, no cyanosis    The results of significant diagnostics from this hospitalization (including imaging, microbiology, ancillary and laboratory) are listed below for reference.     Microbiology: Recent Results (from the past 240 hour(s))  Resp Panel by RT-PCR (Flu A&B, Covid) Nasopharyngeal Swab     Status: None   Collection Time: 11/04/20  3:04 PM   Specimen: Nasopharyngeal Swab; Nasopharyngeal(NP) swabs in vial transport medium  Result Value Ref Range Status   SARS Coronavirus 2 by RT PCR NEGATIVE NEGATIVE Final    Comment: (NOTE) SARS-CoV-2 target nucleic acids are NOT DETECTED.  The SARS-CoV-2 RNA is generally detectable in upper respiratory specimens during the acute phase of infection. The lowest concentration of SARS-CoV-2 viral copies this assay can detect is 138 copies/mL. A negative result does not preclude SARS-Cov-2 infection and should not be used as the sole basis for treatment or other patient management decisions. A negative result may occur with  improper specimen collection/handling, submission of specimen other than nasopharyngeal swab, presence of viral mutation(s) within the areas targeted by this assay, and inadequate number of viral copies(<138 copies/mL). A negative result must be combined with clinical observations, patient history, and epidemiological information. The expected result is Negative.  Fact Sheet for Patients:   EntrepreneurPulse.com.au  Fact Sheet for Healthcare Providers:  IncredibleEmployment.be  This test is no t yet approved or cleared by the [place] FDA and  has been authorized for detection and/or diagnosis of SARS-CoV-2 by FDA under an Emergency Use Authorization (EUA). This EUA will remain  in effect (meaning this test can be used) for the duration of the COVID-19 declaration under Section 564(b)(1) of the Act, 21 U.S.C.section 360bbb-3(b)(1), unless the authorization is terminated  or revoked sooner.       Influenza A by PCR NEGATIVE NEGATIVE Final   Influenza B by PCR NEGATIVE NEGATIVE Final    Comment: (NOTE) The Xpert Xpress SARS-CoV-2/FLU/RSV plus assay is intended as an aid in the diagnosis of influenza from Nasopharyngeal swab specimens and should not be used as a sole basis for treatment. Nasal washings and aspirates are unacceptable for Xpert Xpress SARS-CoV-2/FLU/RSV testing.  Fact Sheet for Patients: EntrepreneurPulse.com.au  Fact Sheet for Healthcare Providers: IncredibleEmployment.be  This test is not yet approved or cleared by the [place] FDA and has been authorized for detection and/or diagnosis of SARS-CoV-2 by FDA under an Emergency Use Authorization (EUA). This EUA  will remain in effect (meaning this test can be used) for the duration of the COVID-19 declaration under Section 564(b)(1) of the Act, 21 U.S.C. section 360bbb-3(b)(1), unless the authorization is terminated or revoked.  Performed at [facility], [place] [address], [place], [place] [address]      Labs: BNP (last 3 results) No results for input(s): BNP in the last 8760 hours. Basic Metabolic Panel: Recent Labs  Lab 11/04/20 1045 11/04/20 1419 11/04/20 1933 11/05/20 0716 11/06/20 0622 11/07/20 0338  NA 139 139 140 138 140 139  K 4.5 4.6 4.2 4.0 4.2 4.4  CL 101  --   --  99 102 101  CO2 28  --    --  31 31 31   GLUCOSE 118*  --   --  140* 60* 73  BUN 12  --   --  14 10 7*  CREATININE 0.58  --   --  0.64 0.64 0.54  CALCIUM 9.4  --   --  8.6* 8.6* 8.9   Liver Function Tests: Recent Labs  Lab 11/04/20 1139 11/05/20 0716  AST 17 16  ALT 12 11  ALKPHOS 93 77  BILITOT 0.5 0.4  PROT 6.4* 4.8*  ALBUMIN 3.7 2.8*   No results for input(s): LIPASE, AMYLASE in the last 168 hours. Recent Labs  Lab 11/04/20 1139  AMMONIA 11   CBC: Recent Labs  Lab 11/04/20 1045 11/04/20 1419 11/04/20 1933 11/05/20 0716 11/06/20 0452 11/07/20 0338  WBC 4.4  --   --  4.0 3.6* 3.2*  NEUTROABS  --   --   --   --  2.0 1.9  HGB 13.2 15.3* 13.3 11.5* 11.7* 11.8*  HCT 44.2 45.0 39.0 38.6 38.8 36.6  MCV 94.6  --   --  94.6 94.6 91.5  PLT 132*  --   --  129* 132* 135*   Cardiac Enzymes: No results for input(s): CKTOTAL, CKMB, CKMBINDEX, TROPONINI in the last 168 hours. BNP: Invalid input(s): POCBNP CBG: Recent Labs  Lab 11/04/20 1038 11/04/20 1224  GLUCAP 108* 104*   D-Dimer No results for input(s): DDIMER in the last 72 hours. Hgb A1c No results for input(s): HGBA1C in the last 72 hours. Lipid Profile No results for input(s): CHOL, HDL, LDLCALC, TRIG, CHOLHDL, LDLDIRECT in the last 72 hours. Thyroid function studies No results for input(s): TSH, T4TOTAL, T3FREE, THYROIDAB in the last 72 hours.  Invalid input(s): FREET3 Anemia work up No results for input(s): VITAMINB12, FOLATE, FERRITIN, TIBC, IRON, RETICCTPCT in the last 72 hours. Urinalysis    Component Value Date/Time   COLORURINE YELLOW 11/04/2020 [place] 11/04/2020 1213   LABSPEC 1.018 11/04/2020 1213   PHURINE 5.0 11/04/2020 1213   GLUCOSEU NEGATIVE 11/04/2020 1213   HGBUR NEGATIVE 11/04/2020 [place] 11/04/2020 1213   [place] 11/04/2020 1213   PROTEINUR 30 (A) 11/04/2020 1213   NITRITE NEGATIVE 11/04/2020 1213   LEUKOCYTESUR NEGATIVE 11/04/2020 1213   Sepsis  Labs Invalid input(s): PROCALCITONIN,  WBC,  LACTICIDVEN Microbiology Recent Results (from the past 240 hour(s))  Resp Panel by RT-PCR (Flu A&B, Covid) Nasopharyngeal Swab     Status: None   Collection Time: 11/04/20  3:04 PM   Specimen: Nasopharyngeal Swab; Nasopharyngeal(NP) swabs in vial transport medium  Result Value Ref Range Status   SARS Coronavirus 2 by RT PCR NEGATIVE NEGATIVE Final    Comment: (NOTE) SARS-CoV-2 target nucleic acids are NOT DETECTED.  The SARS-CoV-2 RNA is generally  detectable in upper respiratory specimens during the acute phase of infection. The lowest concentration of SARS-CoV-2 viral copies this assay can detect is 138 copies/mL. A negative result does not preclude SARS-Cov-2 infection and should not be used as the sole basis for treatment or other patient management decisions. A negative result may occur with  improper specimen collection/handling, submission of specimen other than nasopharyngeal swab, presence of viral mutation(s) within the areas targeted by this assay, and inadequate number of viral copies(<138 copies/mL). A negative result must be combined with clinical observations, patient history, and epidemiological information. The expected result is Negative.  Fact Sheet for Patients:  EntrepreneurPulse.com.au  Fact Sheet for Healthcare Providers:  IncredibleEmployment.be  This test is no t yet approved or cleared by the [place] FDA and  has been authorized for detection and/or diagnosis of SARS-CoV-2 by FDA under an Emergency Use Authorization (EUA). This EUA will remain  in effect (meaning this test can be used) for the duration of the COVID-19 declaration under Section 564(b)(1) of the Act, 21 U.S.C.section 360bbb-3(b)(1), unless the authorization is terminated  or revoked sooner.       Influenza A by PCR NEGATIVE NEGATIVE Final   Influenza B by PCR NEGATIVE NEGATIVE Final    Comment:  (NOTE) The Xpert Xpress SARS-CoV-2/FLU/RSV plus assay is intended as an aid in the diagnosis of influenza from Nasopharyngeal swab specimens and should not be used as a sole basis for treatment. Nasal washings and aspirates are unacceptable for Xpert Xpress SARS-CoV-2/FLU/RSV testing.  Fact Sheet for Patients: EntrepreneurPulse.com.au  Fact Sheet for Healthcare Providers: IncredibleEmployment.be  This test is not yet approved or cleared by the [place] FDA and has been authorized for detection and/or diagnosis of SARS-CoV-2 by FDA under an Emergency Use Authorization (EUA). This EUA will remain in effect (meaning this test can be used) for the duration of the COVID-19 declaration under Section 564(b)(1) of the Act, 21 U.S.C. section 360bbb-3(b)(1), unless the authorization is terminated or revoked.  Performed at [facility], [place] [address] [place], [place] [address]      Time coordinating discharge: 32 minutes.   SIGNED:   Hosie Poisson, MD  [facility]

## 2021-01-09 DIAGNOSIS — I361 Nonrheumatic tricuspid (valve) insufficiency: Secondary | ICD-10-CM

## 2022-01-13 ENCOUNTER — Other Ambulatory Visit: Payer: Self-pay | Admitting: Internal Medicine

## 2022-01-13 DIAGNOSIS — Z1231 Encounter for screening mammogram for malignant neoplasm of breast: Secondary | ICD-10-CM

## 2022-02-20 ENCOUNTER — Ambulatory Visit: Payer: Medicaid Other

## 2022-03-09 IMAGING — CT CT ABD-PELV W/ CM
2 of 5 series · 15 of 46 positions shown, 17 images · IV contrast (omnipaque)
Comparison: CT 06/14/2020

CLINICAL DATA: Abdominal pain, nonlocalized, altered mental status,
sepsis

EXAM:
CT ABDOMEN AND PELVIS WITH CONTRAST
TECHNIQUE: Multidetector CT imaging of the abdomen and pelvis was performed
using the standard protocol following bolus administration of
intravenous contrast.
CONTRAST:  100mL OMNIPAQUE IOHEXOL 300 MG/ML  SOLN

[Series 3: a/p w/ 5mm · axial · 0.75mm/px · z∈[+716,+1076]mm · 12 of 82 slices shown, 14 images]
[im 5/82  soft-tissue]
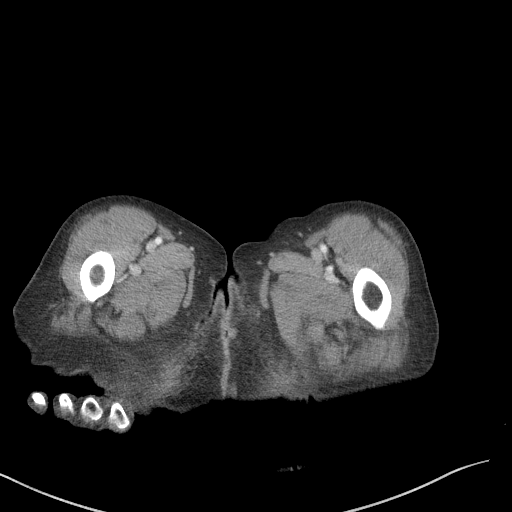
[im 5/82  bone]
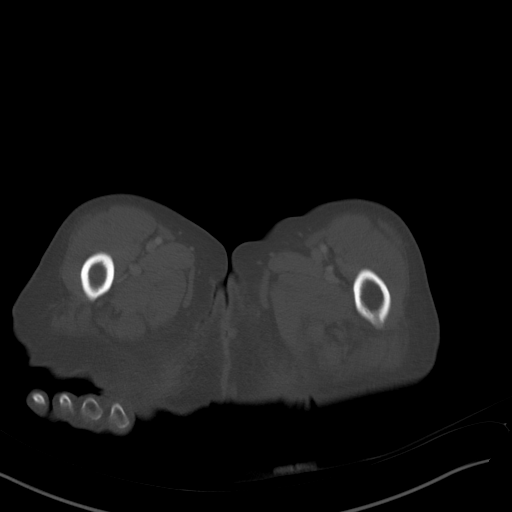
[im 14/82  soft-tissue]
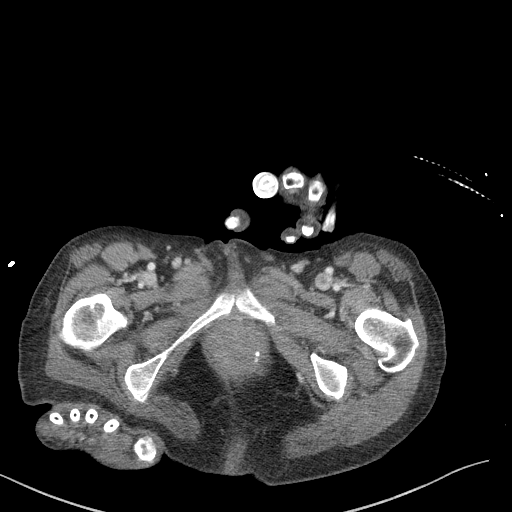
[im 19/82  soft-tissue]
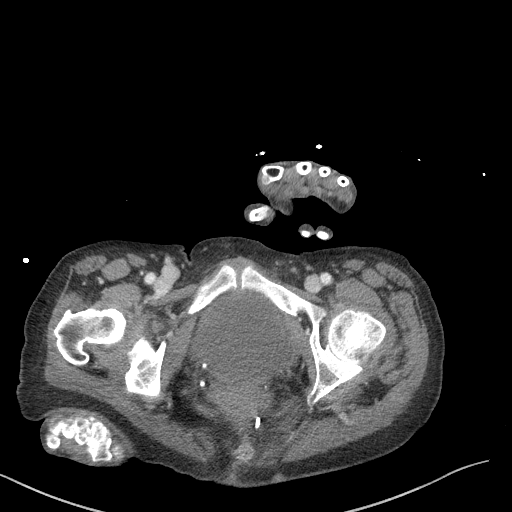
[im 23/82  soft-tissue]
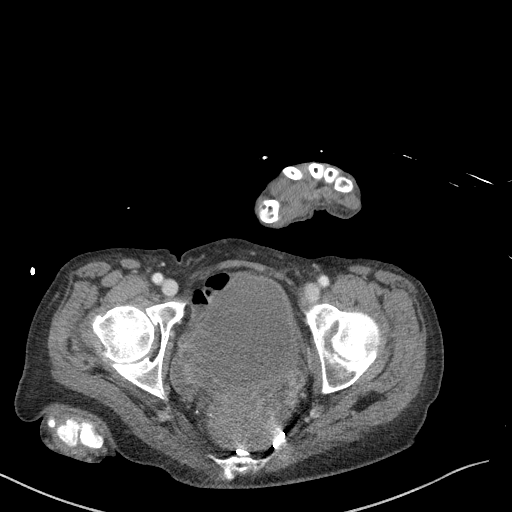
[im 32/82  soft-tissue]
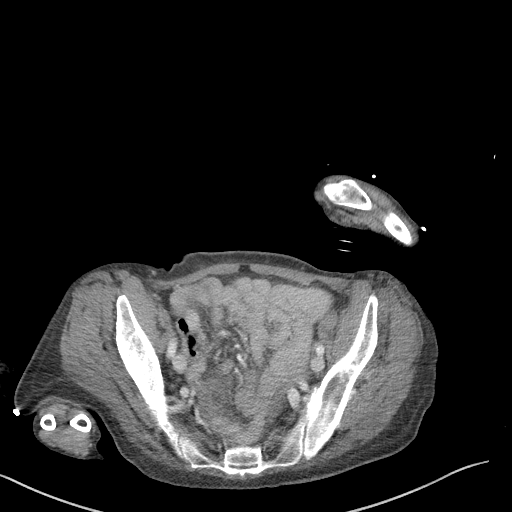
[im 37/82  soft-tissue]
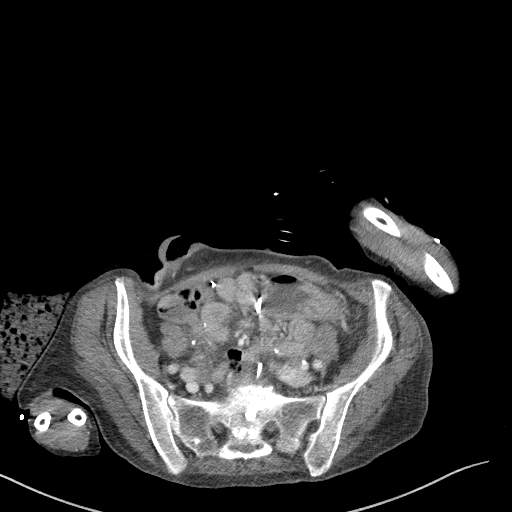
[im 46/82  soft-tissue]
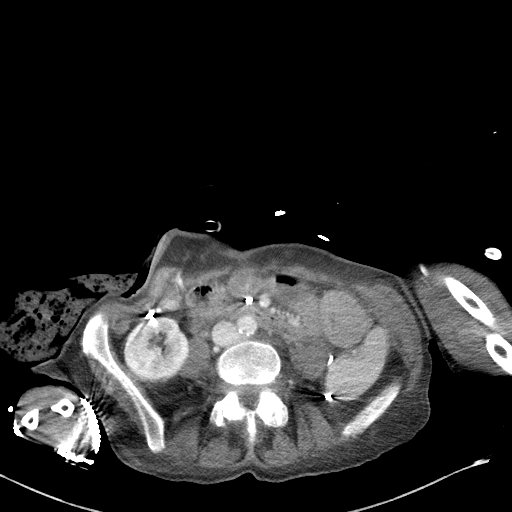
[im 50/82  soft-tissue]
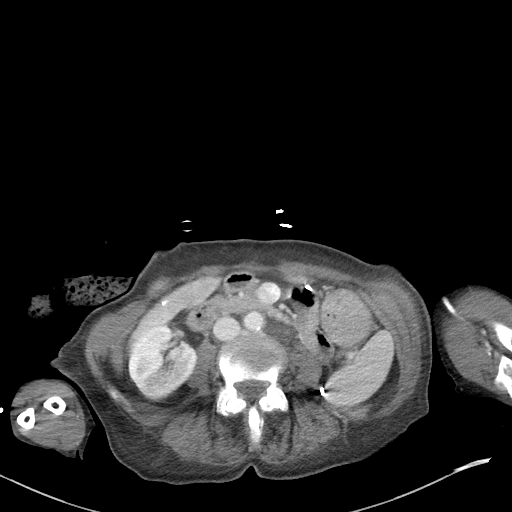
[im 59/82  soft-tissue]
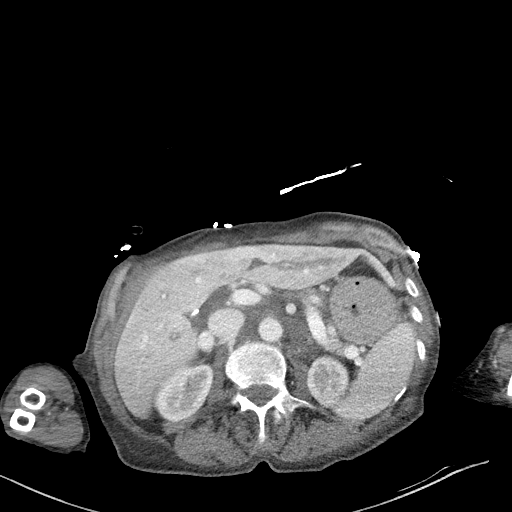
[im 59/82  bone]
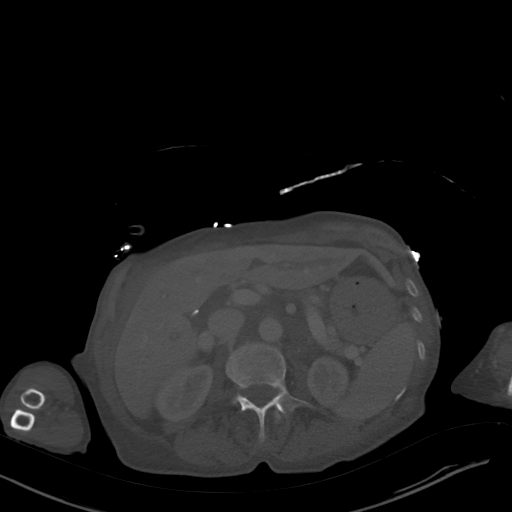
[im 64/82  soft-tissue]
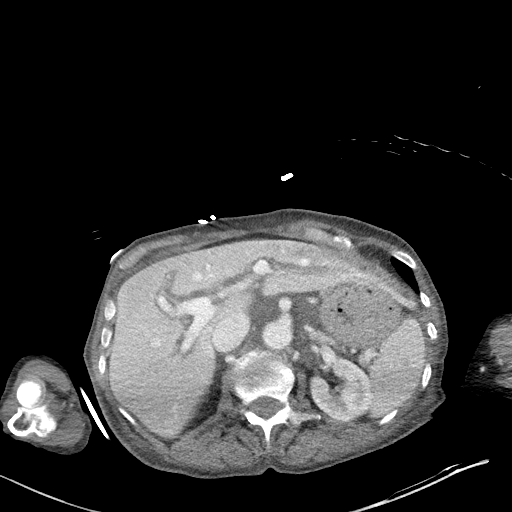
[im 68/82  soft-tissue]
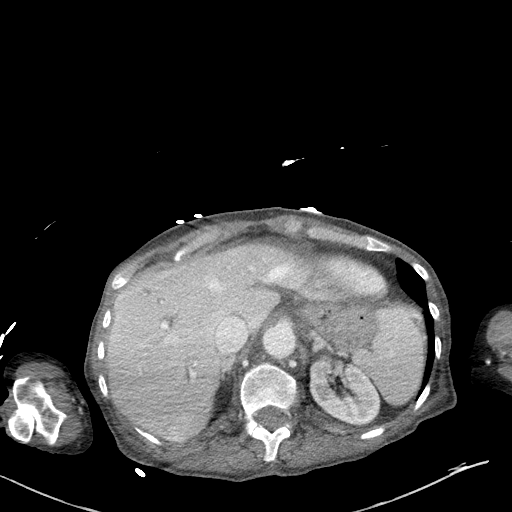
[im 77/82  soft-tissue]
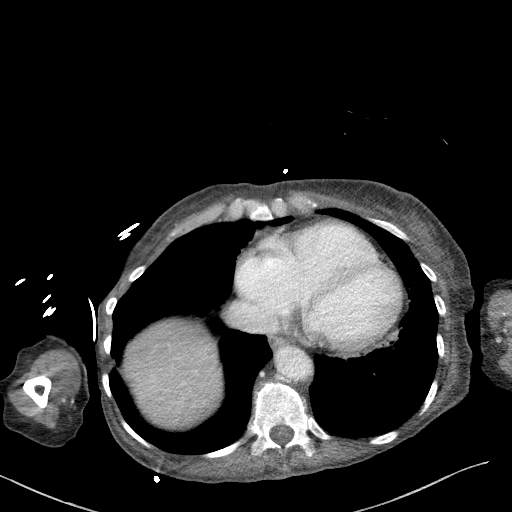

[Series 6: a/p w/ cor · coronal · 0.64mm/px · 3 of 118 slices shown]
[im 40/118  soft-tissue]
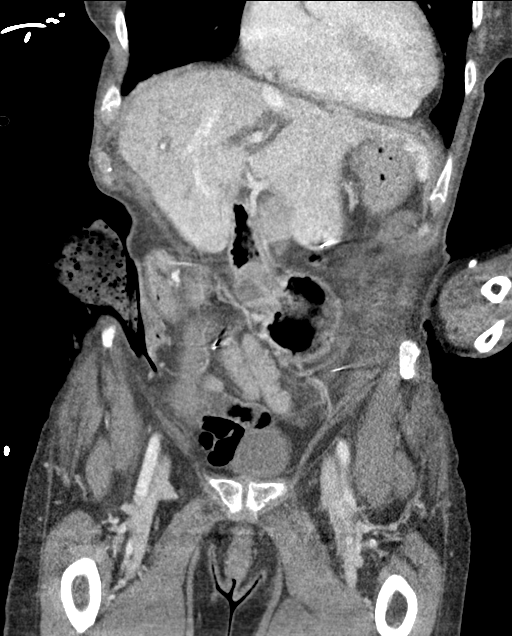
[im 53/118  soft-tissue]
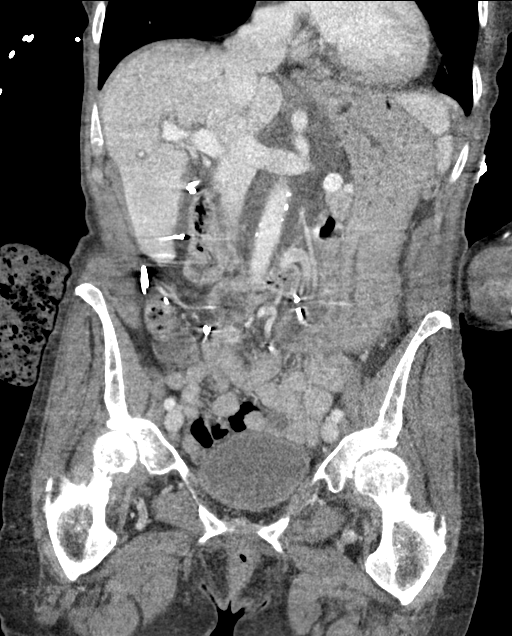
[im 66/118  soft-tissue]
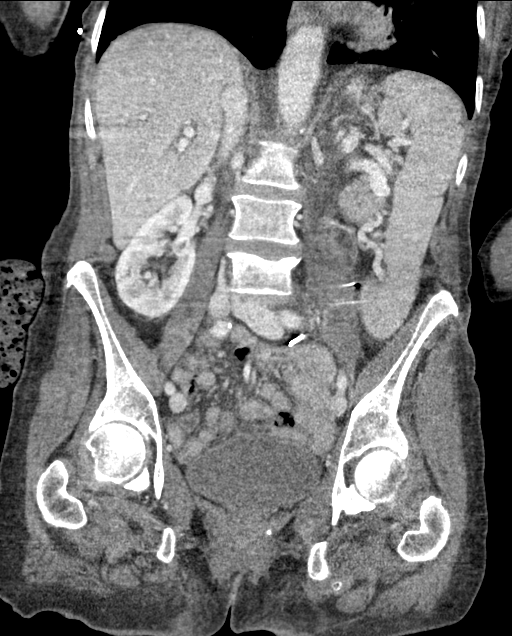

[15 of 46 positions shown; findings below may reference images not displayed]

FINDINGS: Lower chest: Emphysematous changes in the lung bases. Some bandlike
areas of opacity likely subsegmental atelectasis or scarring. Lung
bases otherwise clear. Normal heart size. No pericardial effusion.
Coronary artery atherosclerosis is present. Calcifications also
present upon the aortic leaflets.

Hepatobiliary: Some heterogeneous hepatic enhancement, possibly
related to contrast timing. No focal liver lesion is seen. Smooth
liver surface contour. Diffuse periportal edema noted throughout the
liver. Prior cholecystectomy. Prominence of the biliary tree
possibly reflecting a combination of senescent change and post
cholecystectomy reservoir effect. No visible calcified intraductal
gallstones.

Pancreas: No pancreatic ductal dilatation or surrounding
inflammatory changes.

Spleen: Splenomegaly, similar to prior. No concerning splenic
lesions.

Adrenals/Urinary Tract: Normal adrenal glands. Kidneys are normally
located with symmetric enhancement and excretion. No suspicious
renal lesion, urolithiasis or hydronephrosis. Question slight
bladder wall thickening and mild perivesicular hazy stranding.

Stomach/Bowel: Postsurgical changes from colectomy with right lower
quadrant ileostomy. Fluid-filled appearance of the distal small
bowel is nonspecific. No significant wall thickening or distention
is seen. No resulting obstruction at this level or elsewhere. Small
sliding-type hiatal hernia.

Vascular/Lymphatic: Atherosclerotic calcifications within the
abdominal aorta and branch vessels. No aneurysm or ectasia.
Edematous changes throughout the mesentery with difficult
visualization of the abdominopelvic lymph nodes. No worrisome
adenopathy is evident.

Reproductive: Patient appears to be post hysterectomy. No concerning
adnexal lesion.

Other: Diffuse edematous changes throughout the mesentery likely
with small volume free fluid in the abdomen and pelvis. Diffuse body
wall edema noted as well. Right lower quadrant ostomy.

Musculoskeletal: Stable superior endplate L4 compression deformity.
Grade 1 anterolisthesis L4 on L5 is unchanged from prior. Bone
island in S1 is also stable. No acute osseous abnormality or
suspicious osseous lesion.
IMPRESSION: 1. Postsurgical changes from colectomy with right lower quadrant
ileostomy. Fluid-filled appearance of the distal small bowel is
nonspecific, but can be seen with enteritis. No significant wall
thickening or distention is seen.
2. Question slight bladder wall thickening and mild perivesicular
hazy stranding. Correlate with urinalysis to exclude cystitis.
3. Diffuse edematous changes throughout the mesentery likely with
small volume free fluid in the abdomen and pelvis. Diffuse body wall
edema noted as well. Correlate for volume third-spacing/anasarca.
4. Periportal edema is present, can be seen in the setting of volume
overload versus intrinsic liver disease. Correlate with LFTs.
5. Splenomegaly, similar to prior.
6. Prior cholecystectomy.  Likely mild reservoir effect.
7. Aortic Atherosclerosis ([id number].D).

These results were called by telephone at the time of interpretation
on 10/09/2020 at [DATE] to provider MELENCIO BESHIR , who verbally
acknowledged these results.

## 2023-07-16 ENCOUNTER — Emergency Department (HOSPITAL_COMMUNITY)
Admission: EM | Admit: 2023-07-16 | Discharge: 2023-07-17 | Disposition: A | Discharge status: Home / Self Care | Payer: Medicaid Other | Attending: Emergency Medicine | Admitting: Emergency Medicine

## 2023-07-16 ENCOUNTER — Emergency Department (HOSPITAL_COMMUNITY): Payer: Medicaid Other

## 2023-07-16 ENCOUNTER — Other Ambulatory Visit: Payer: Self-pay

## 2023-07-16 ENCOUNTER — Encounter (HOSPITAL_COMMUNITY): Payer: Self-pay

## 2023-07-16 DIAGNOSIS — M545 Low back pain, unspecified: Secondary | ICD-10-CM | POA: Diagnosis not present

## 2023-07-16 DIAGNOSIS — Z932 Ileostomy status: Secondary | ICD-10-CM | POA: Diagnosis not present

## 2023-07-16 DIAGNOSIS — W19XXXA Unspecified fall, initial encounter: Secondary | ICD-10-CM

## 2023-07-16 DIAGNOSIS — J449 Chronic obstructive pulmonary disease, unspecified: Secondary | ICD-10-CM | POA: Insufficient documentation

## 2023-07-16 DIAGNOSIS — M25562 Pain in left knee: Secondary | ICD-10-CM | POA: Insufficient documentation

## 2023-07-16 DIAGNOSIS — R519 Headache, unspecified: Secondary | ICD-10-CM | POA: Insufficient documentation

## 2023-07-16 DIAGNOSIS — R1084 Generalized abdominal pain: Secondary | ICD-10-CM | POA: Diagnosis not present

## 2023-07-16 DIAGNOSIS — M542 Cervicalgia: Secondary | ICD-10-CM | POA: Diagnosis not present

## 2023-07-16 DIAGNOSIS — R102 Pelvic and perineal pain: Secondary | ICD-10-CM | POA: Insufficient documentation

## 2023-07-16 DIAGNOSIS — Z043 Encounter for examination and observation following other accident: Secondary | ICD-10-CM | POA: Diagnosis not present

## 2023-07-16 DIAGNOSIS — W1839XA Other fall on same level, initial encounter: Secondary | ICD-10-CM | POA: Diagnosis not present

## 2023-07-16 DIAGNOSIS — M25561 Pain in right knee: Secondary | ICD-10-CM | POA: Diagnosis present

## 2023-07-16 MED ORDER — ACETAMINOPHEN 500 MG PO TABS
500.0000 mg | ORAL_TABLET | Freq: Once | ORAL | Status: AC
  Administered 2023-07-16: 500 mg via ORAL
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
## 2023-07-16 NOTE — ED Notes (Signed)
Pt attempting to find a place to go and a ride to get there.

## 2023-07-16 NOTE — Discharge Instructions (Signed)
Please follow-up with your primary care provider for recent visit.  May take Tylenol every 6 hours for pain.  If symptoms change or worsen please return to ER.

## 2023-07-17 ENCOUNTER — Other Ambulatory Visit: Payer: Self-pay

## 2023-07-17 ENCOUNTER — Encounter (HOSPITAL_COMMUNITY): Payer: Self-pay

## 2023-07-17 ENCOUNTER — Emergency Department (HOSPITAL_COMMUNITY)
Admission: EM | Admit: 2023-07-17 | Discharge: 2023-07-18 | Discharge status: Against Medical Advice | Payer: Medicaid Other | Source: Home / Self Care | Attending: Emergency Medicine | Admitting: Emergency Medicine

## 2023-07-17 DIAGNOSIS — R1084 Generalized abdominal pain: Secondary | ICD-10-CM

## 2023-07-17 DIAGNOSIS — M542 Cervicalgia: Secondary | ICD-10-CM | POA: Insufficient documentation

## 2023-07-17 DIAGNOSIS — R519 Headache, unspecified: Secondary | ICD-10-CM | POA: Insufficient documentation

## 2023-07-17 DIAGNOSIS — M545 Low back pain, unspecified: Secondary | ICD-10-CM | POA: Insufficient documentation

## 2023-07-17 DIAGNOSIS — W01198A Fall on same level from slipping, tripping and stumbling with subsequent striking against other object, initial encounter: Secondary | ICD-10-CM | POA: Insufficient documentation

## 2023-07-17 LAB — COMPREHENSIVE METABOLIC PANEL
ALT: 19 U/L (ref 0–44)
AST: 29 U/L (ref 15–41)
Albumin: 4 g/dL (ref 3.5–5.0)
Alkaline Phosphatase: 154 U/L — ABNORMAL HIGH (ref 38–126)
Anion gap: 10 (ref 5–15)
BUN: 41 mg/dL — ABNORMAL HIGH (ref 8–23)
CO2: 24 mmol/L (ref 22–32)
Calcium: 9 mg/dL (ref 8.9–10.3)
Chloride: 104 mmol/L (ref 98–111)
Creatinine, Ser: 0.81 mg/dL (ref 0.44–1.00)
GFR, Estimated: >60 mL/min (ref >60)
Glucose, Bld: 92 mg/dL (ref 70–99)
Potassium: 3.7 mmol/L (ref 3.5–5.1)
Sodium: 138 mmol/L (ref 135–145)
Total Bilirubin: 1 mg/dL (ref 0.3–1.2)
Total Protein: 7.1 g/dL (ref 6.5–8.1)

## 2023-07-17 LAB — LIPASE, BLOOD: Lipase: 22 U/L (ref 11–51)

## 2023-07-17 LAB — CBC
HCT: 41.8 % (ref 36.0–46.0)
Hemoglobin: 13.1 g/dL (ref 12.0–15.0)
MCH: 29.8 pg (ref 26.0–34.0)
MCHC: 31.3 g/dL (ref 30.0–36.0)
MCV: 95.2 fL (ref 80.0–100.0)
Platelets: 240 10*3/uL (ref 150–400)
RBC: 4.39 MIL/uL (ref 3.87–5.11)
RDW: 12.6 % (ref 11.5–15.5)
WBC: 5.4 10*3/uL (ref 4.0–10.5)
nRBC: 0 % (ref 0.0–0.2)
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
## 2023-07-18 ENCOUNTER — Emergency Department (HOSPITAL_COMMUNITY): Payer: Medicaid Other

## 2023-07-18 ENCOUNTER — Encounter (HOSPITAL_COMMUNITY): Payer: Self-pay

## 2023-07-18 ENCOUNTER — Other Ambulatory Visit: Payer: Self-pay

## 2023-07-18 ENCOUNTER — Emergency Department (HOSPITAL_COMMUNITY)
Admission: EM | Admit: 2023-07-18 | Discharge: 2023-07-18 | Disposition: A | Discharge status: Home / Self Care | Payer: Medicaid Other | Source: Home / Self Care | Attending: Emergency Medicine | Admitting: Emergency Medicine

## 2023-07-18 DIAGNOSIS — R339 Retention of urine, unspecified: Secondary | ICD-10-CM

## 2023-07-18 DIAGNOSIS — Z043 Encounter for examination and observation following other accident: Secondary | ICD-10-CM | POA: Insufficient documentation

## 2023-07-18 DIAGNOSIS — R4182 Altered mental status, unspecified: Secondary | ICD-10-CM | POA: Insufficient documentation

## 2023-07-18 DIAGNOSIS — R109 Unspecified abdominal pain: Secondary | ICD-10-CM | POA: Diagnosis not present

## 2023-07-18 DIAGNOSIS — M542 Cervicalgia: Secondary | ICD-10-CM

## 2023-07-18 DIAGNOSIS — W19XXXA Unspecified fall, initial encounter: Secondary | ICD-10-CM | POA: Diagnosis not present

## 2023-07-18 LAB — URINALYSIS, ROUTINE W REFLEX MICROSCOPIC
Bacteria, UA: NONE SEEN
Bilirubin Urine: NEGATIVE
Glucose, UA: NEGATIVE mg/dL
Hgb urine dipstick: NEGATIVE
Ketones, ur: 5 mg/dL — AB
Nitrite: NEGATIVE
Protein, ur: NEGATIVE mg/dL
Specific Gravity, Urine: 1.024 (ref 1.005–1.030)
pH: 6 (ref 5.0–8.0)

## 2023-07-18 NOTE — ED Provider Notes (Signed)
[place] EMERGENCY DEPARTMENT AT [facility] Provider Note   CSN: [id number] Arrival date & time: 07/18/23  7829     History  No chief complaint on file.   Katelyn Villegas is a 62 y.o. female.  62 yo F here after being immediately discharged minutes ago has returned wanting CT imaging.  Reportedly had declined earlier.  Now states she is willing to have them done.  Had a fall a couple days ago it is worried about her head and neck.        Home Medications Prior to Admission medications   Medication Sig Start Date End Date Taking? Authorizing Provider  acetaminophen (TYLENOL) 500 MG tablet Take 500 mg by mouth every 6 (six) hours as needed for moderate pain or headache.    [provider]  albuterol (VENTOLIN HFA) 108 (90 Base) MCG/ACT inhaler INHALE 1-2 PUFFS INTO THE LUNGS EVERY 6 (SIX) HOURS AS NEEDED FOR WHEEZING OR SHORTNESS OF BREATH. 11/07/20 11/07/21  Kathlen Mody, MD  buprenorphine-naloxone (SUBOXONE) 8-2 mg SUBL SL tablet Place 1 tablet under the tongue 2 (two) times daily. 10/12/20   Danford, Earl Lites, MD  dexlansoprazole (DEXILANT) 60 MG capsule Take 1 capsule (60 mg total) by mouth daily. 10/12/20 01/10/21  Lorin Glass, MD  diazepam (VALIUM) 10 MG tablet Take 10 mg by mouth 2 (two) times daily as needed for anxiety.    [provider]  Hyprom-Naphaz-Polysorb-Zn Sulf (CLEAR EYES COMPLETE OP) Place 1 drop into both eyes daily as needed (dry eyes).    [provider]  ipratropium-albuterol (DUONEB) 0.5-2.5 (3) MG/3ML SOLN Take 3 mLs by nebulization 4 (four) times daily as needed (shortness of breath). 11/07/20   Kathlen Mody, MD      Allergies    Morphine, Procaine, Aspirin, Sulfa antibiotics, Sulfamethoxazole, Meperidine, and Penicillins    Review of Systems   Review of Systems  Physical Exam Updated Vital Signs There were no vitals taken for this visit. Physical Exam Vitals and nursing note reviewed.   Constitutional:      General: She is not in acute distress.    Appearance: She is well-developed. She is not diaphoretic.  HENT:     Head: Normocephalic and atraumatic.  Eyes:     Pupils: Pupils are equal, round, and reactive to light.  Cardiovascular:     Rate and Rhythm: Normal rate and regular rhythm.     Heart sounds: No murmur heard.    No friction rub. No gallop.  Pulmonary:     Effort: Pulmonary effort is normal.     Breath sounds: No wheezing or rales.  Abdominal:     General: There is no distension.     Palpations: Abdomen is soft.     Tenderness: There is no abdominal tenderness.  Musculoskeletal:        General: No tenderness.     Cervical back: Normal range of motion and neck supple.  Skin:    General: Skin is warm and dry.  Neurological:     Mental Status: She is alert and oriented to person, place, and time.  Psychiatric:        Behavior: Behavior normal.     ED Results / Procedures / Treatments   Labs (all labs ordered are listed, but only abnormal results are displayed) Labs Reviewed - No data to display  EKG None  Radiology DG Pelvis 1-2 Views  Result Date: 07/16/2023 CLINICAL DATA:  Fall with groin pain EXAM: PELVIS - 1-2 VIEW COMPARISON:  CT 10/09/2020, 07/13/2023 FINDINGS: Contrast in the urinary bladder. Postsurgical changes with numerous clips in the pelvis. SI joints are non widened. Pubic symphysis and rami appear intact. No fracture or dislocation. Mild bilateral hip degenerative change. IMPRESSION: No acute osseous abnormality. Electronically Signed   By: Jasmine Pang M.D.   On: 07/16/2023 21:40   DG Knee Complete 4 Views Right  Result Date: 07/16/2023 CLINICAL DATA:  Fall, landed on the knees, with pain EXAM: RIGHT KNEE - COMPLETE 4+ VIEW COMPARISON:  None Available. FINDINGS: No fracture or malalignment. Mild patellofemoral degenerative change and medial joint space degenerative change. No sizable effusion IMPRESSION: Mild degenerative  change. Electronically Signed   By: Jasmine Pang M.D.   On: 07/16/2023 21:38   DG Knee Complete 4 Views Left  Result Date: 07/16/2023 CLINICAL DATA:  Fall, landed on the knees pain to the patellar area EXAM: LEFT KNEE - COMPLETE 4+ VIEW COMPARISON:  None Available. FINDINGS: No definitive fracture or malalignment. No sizable effusion. Mild tricompartment arthritis of the knee. IMPRESSION: No acute osseous abnormality. Electronically Signed   By: Jasmine Pang M.D.   On: 07/16/2023 21:38    Procedures Procedures    Medications Ordered in ED Medications - No data to display  ED Course/ Medical Decision Making/ A&P                                 Medical Decision Making Amount and/or Complexity of Data Reviewed Radiology: ordered.   62 yo F with a chief complaints of wanting CT imaging.  I reviewed her note from just minutes ago, patient had suffered a fall from a couple days ago with plans for CT of head C-spine and abdomen and pelvis.  Patient declined and left AGAINST MEDICAL ADVICE now back for imaging.  Will obtain here.  CT imaging is largely unremarkable, CT of the head and C-spine without obvious acute finding.  CT of the abdomen pelvis however shows a enlarged bladder.  I am not sure of the significance of this.  I did discuss the results with the patient.  She does tell me that she has had some trouble urinating.  Risks and benefits were discussed and she is currently electing for Foley catheter placement.  Foley placed without issue.  Will discharge home.  Urology follow-up.  Patient also found to have lung nodules on CT imaging.  Will have her follow-up with her PCP for dedicated imaging.  9:12 AM:  I have discussed the diagnosis/risks/treatment options with the patient.  Evaluation and diagnostic testing in the emergency department does not suggest an emergent condition requiring admission or immediate intervention beyond what has been performed at this time.  They will follow  up with Urology, PCP. We also discussed returning to the ED immediately if new or worsening sx occur. We discussed the sx which are most concerning (e.g., sudden worsening pain, fever, inability to tolerate by mouth) that necessitate immediate return. Medications administered to the patient during their visit and any new prescriptions provided to the patient are listed below.  Medications given during this visit Medications - No data to display   The patient appears reasonably screen and/or stabilized for discharge and I doubt any other medical condition or other [facility] requiring further screening, evaluation, or treatment in the ED at this time prior to discharge.          Final Clinical Impression(s) / ED Diagnoses Final diagnoses:  None    Rx / DC Orders ED Discharge Orders     None         Melene Plan, DO 07/18/23 [phone]
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
## 2023-07-18 NOTE — ED Triage Notes (Signed)
Pt coming in today complaining of neck and hip pain. Pt able to bear weight and walk. Pt seen earlier today.

## 2023-07-18 NOTE — ED Notes (Addendum)
Pt refusing to leave. Wants a brief placed before she leaves. Escorted out by staff members.   Refuses to sign AMA form.   Care discussed with MD at bedside multiple times throughout the night. Multiple times declining all interventions.

## 2023-07-18 NOTE — Discharge Instructions (Addendum)
Your CT showed that you have some lung nodules.  These typically will be further evaluated by your family doctor with CT imaging of the chest.  Please discuss this finding with them.  Please follow-up with the urologist in the office.

## 2023-07-18 NOTE — ED Notes (Signed)
Ambulatory to restroom but says she wants to pee in the bed instead.

## 2023-07-18 NOTE — ED Notes (Signed)
Verbally aggressive. Accusing staff of stealing her personal belongings. Pt refuses IV placement with vulgar language. Two pills found on bed and pt immediately places them in her mouth.   Says transfer me to [facility].

## 2023-07-18 NOTE — Discharge Instructions (Signed)
You declined CT imaging and are leaving AGAINST MEDICAL ADVICE.  Return to the ED if you wish to be reevaluated.

## 2023-07-18 NOTE — ED Notes (Signed)
Patient given bus passes and walked to bus stop.

## 2023-07-18 NOTE — ED Notes (Addendum)
Continues to refuse all treatment  Pt able to change own colostomy. Supplies provided
